# Patient Record
Sex: Male | Born: 1960
Health system: Southern US, Community
[De-identification: ages and names within clinical notes are randomized; demographics above are authoritative.]

## PROBLEM LIST (undated history)

## (undated) DIAGNOSIS — I10 Essential (primary) hypertension: Secondary | ICD-10-CM

## (undated) DIAGNOSIS — E669 Obesity, unspecified: Secondary | ICD-10-CM

## (undated) DIAGNOSIS — R739 Hyperglycemia, unspecified: Secondary | ICD-10-CM

## (undated) DIAGNOSIS — C61 Malignant neoplasm of prostate: Secondary | ICD-10-CM

## (undated) DIAGNOSIS — I2510 Atherosclerotic heart disease of native coronary artery without angina pectoris: Secondary | ICD-10-CM

## (undated) DIAGNOSIS — R042 Hemoptysis: Secondary | ICD-10-CM

## (undated) DIAGNOSIS — N63 Unspecified lump in unspecified breast: Secondary | ICD-10-CM

## (undated) DIAGNOSIS — R918 Other nonspecific abnormal finding of lung field: Secondary | ICD-10-CM

## (undated) DIAGNOSIS — E785 Hyperlipidemia, unspecified: Secondary | ICD-10-CM

## (undated) DIAGNOSIS — J439 Emphysema, unspecified: Secondary | ICD-10-CM

## (undated) DIAGNOSIS — I208 Other forms of angina pectoris: Secondary | ICD-10-CM

## (undated) DIAGNOSIS — J301 Allergic rhinitis due to pollen: Secondary | ICD-10-CM

## (undated) DIAGNOSIS — Z7901 Long term (current) use of anticoagulants: Secondary | ICD-10-CM

## (undated) DIAGNOSIS — F419 Anxiety disorder, unspecified: Secondary | ICD-10-CM

## (undated) DIAGNOSIS — I480 Paroxysmal atrial fibrillation: Secondary | ICD-10-CM

## (undated) DIAGNOSIS — Z6834 Body mass index (BMI) 34.0-34.9, adult: Secondary | ICD-10-CM

## (undated) DIAGNOSIS — R972 Elevated prostate specific antigen [PSA]: Secondary | ICD-10-CM

## (undated) HISTORY — DX: Body mass index (BMI) 34.0-34.9, adult: Z68.34

## (undated) HISTORY — DX: Essential (primary) hypertension: I10

## (undated) HISTORY — DX: Allergic rhinitis due to pollen: J30.1

## (undated) HISTORY — DX: Other forms of angina pectoris: I20.8

## (undated) HISTORY — DX: Anxiety disorder, unspecified: F41.9

## (undated) HISTORY — DX: Hyperglycemia, unspecified: R73.9

## (undated) HISTORY — DX: Hemoptysis: R04.2

## (undated) HISTORY — PX: PROSTATE BIOPSY: SHX241

## (undated) HISTORY — PX: NO PAST SURGERIES: SHX2092

## (undated) HISTORY — DX: Unspecified lump in unspecified breast: N63.0

## (undated) HISTORY — DX: Elevated prostate specific antigen (PSA): R97.20

## (undated) HISTORY — DX: Obesity, unspecified: E66.9

## (undated) HISTORY — DX: Hyperlipidemia, unspecified: E78.5

---

## 2010-06-25 HISTORY — PX: COLONOSCOPY WITH PROPOFOL: SHX5780

## 2016-03-26 DIAGNOSIS — Z6831 Body mass index (BMI) 31.0-31.9, adult: Secondary | ICD-10-CM | POA: Diagnosis not present

## 2016-03-26 DIAGNOSIS — F419 Anxiety disorder, unspecified: Secondary | ICD-10-CM | POA: Diagnosis not present

## 2016-03-26 DIAGNOSIS — I10 Essential (primary) hypertension: Secondary | ICD-10-CM | POA: Diagnosis not present

## 2016-03-26 DIAGNOSIS — E669 Obesity, unspecified: Secondary | ICD-10-CM | POA: Diagnosis not present

## 2016-06-12 DIAGNOSIS — R739 Hyperglycemia, unspecified: Secondary | ICD-10-CM | POA: Diagnosis not present

## 2016-06-12 DIAGNOSIS — I10 Essential (primary) hypertension: Secondary | ICD-10-CM | POA: Diagnosis not present

## 2016-06-12 DIAGNOSIS — Z23 Encounter for immunization: Secondary | ICD-10-CM | POA: Diagnosis not present

## 2016-06-12 DIAGNOSIS — E669 Obesity, unspecified: Secondary | ICD-10-CM | POA: Diagnosis not present

## 2016-06-12 DIAGNOSIS — Z0000 Encounter for general adult medical examination without abnormal findings: Secondary | ICD-10-CM | POA: Diagnosis not present

## 2016-07-06 DIAGNOSIS — R972 Elevated prostate specific antigen [PSA]: Secondary | ICD-10-CM | POA: Diagnosis not present

## 2016-07-06 DIAGNOSIS — E669 Obesity, unspecified: Secondary | ICD-10-CM | POA: Diagnosis not present

## 2016-07-06 DIAGNOSIS — Z6832 Body mass index (BMI) 32.0-32.9, adult: Secondary | ICD-10-CM | POA: Diagnosis not present

## 2017-01-07 DIAGNOSIS — Z1389 Encounter for screening for other disorder: Secondary | ICD-10-CM | POA: Diagnosis not present

## 2017-01-07 DIAGNOSIS — I10 Essential (primary) hypertension: Secondary | ICD-10-CM | POA: Diagnosis not present

## 2017-01-07 DIAGNOSIS — E669 Obesity, unspecified: Secondary | ICD-10-CM | POA: Diagnosis not present

## 2017-01-07 DIAGNOSIS — F419 Anxiety disorder, unspecified: Secondary | ICD-10-CM | POA: Diagnosis not present

## 2017-05-21 DIAGNOSIS — D225 Melanocytic nevi of trunk: Secondary | ICD-10-CM | POA: Diagnosis not present

## 2017-05-21 DIAGNOSIS — D224 Melanocytic nevi of scalp and neck: Secondary | ICD-10-CM | POA: Diagnosis not present

## 2017-05-21 DIAGNOSIS — L821 Other seborrheic keratosis: Secondary | ICD-10-CM | POA: Diagnosis not present

## 2017-05-21 DIAGNOSIS — D22121 Melanocytic nevi of left upper eyelid, including canthus: Secondary | ICD-10-CM | POA: Diagnosis not present

## 2017-06-13 DIAGNOSIS — I10 Essential (primary) hypertension: Secondary | ICD-10-CM | POA: Diagnosis not present

## 2017-06-13 DIAGNOSIS — F419 Anxiety disorder, unspecified: Secondary | ICD-10-CM | POA: Diagnosis not present

## 2017-06-13 DIAGNOSIS — Z23 Encounter for immunization: Secondary | ICD-10-CM | POA: Diagnosis not present

## 2017-06-13 DIAGNOSIS — Z0000 Encounter for general adult medical examination without abnormal findings: Secondary | ICD-10-CM | POA: Diagnosis not present

## 2017-07-12 DIAGNOSIS — J029 Acute pharyngitis, unspecified: Secondary | ICD-10-CM | POA: Diagnosis not present

## 2017-07-12 DIAGNOSIS — Z6834 Body mass index (BMI) 34.0-34.9, adult: Secondary | ICD-10-CM | POA: Diagnosis not present

## 2018-01-16 DIAGNOSIS — J019 Acute sinusitis, unspecified: Secondary | ICD-10-CM | POA: Diagnosis not present

## 2018-01-16 DIAGNOSIS — I10 Essential (primary) hypertension: Secondary | ICD-10-CM | POA: Diagnosis not present

## 2018-01-16 DIAGNOSIS — F419 Anxiety disorder, unspecified: Secondary | ICD-10-CM | POA: Diagnosis not present

## 2018-01-16 DIAGNOSIS — J301 Allergic rhinitis due to pollen: Secondary | ICD-10-CM | POA: Diagnosis not present

## 2018-07-03 DIAGNOSIS — N6320 Unspecified lump in the left breast, unspecified quadrant: Secondary | ICD-10-CM | POA: Diagnosis not present

## 2018-07-03 DIAGNOSIS — F419 Anxiety disorder, unspecified: Secondary | ICD-10-CM | POA: Diagnosis not present

## 2018-07-03 DIAGNOSIS — Z79899 Other long term (current) drug therapy: Secondary | ICD-10-CM | POA: Diagnosis not present

## 2018-07-03 DIAGNOSIS — Z0000 Encounter for general adult medical examination without abnormal findings: Secondary | ICD-10-CM | POA: Diagnosis not present

## 2018-07-14 DIAGNOSIS — H00022 Hordeolum internum right lower eyelid: Secondary | ICD-10-CM | POA: Diagnosis not present

## 2018-07-17 DIAGNOSIS — F419 Anxiety disorder, unspecified: Secondary | ICD-10-CM | POA: Diagnosis not present

## 2018-07-17 DIAGNOSIS — I10 Essential (primary) hypertension: Secondary | ICD-10-CM | POA: Diagnosis not present

## 2018-07-17 DIAGNOSIS — H00019 Hordeolum externum unspecified eye, unspecified eyelid: Secondary | ICD-10-CM | POA: Diagnosis not present

## 2018-07-17 DIAGNOSIS — R972 Elevated prostate specific antigen [PSA]: Secondary | ICD-10-CM | POA: Diagnosis not present

## 2018-07-24 DIAGNOSIS — N6489 Other specified disorders of breast: Secondary | ICD-10-CM | POA: Diagnosis not present

## 2018-07-24 DIAGNOSIS — R972 Elevated prostate specific antigen [PSA]: Secondary | ICD-10-CM | POA: Diagnosis not present

## 2018-07-24 DIAGNOSIS — R928 Other abnormal and inconclusive findings on diagnostic imaging of breast: Secondary | ICD-10-CM | POA: Diagnosis not present

## 2018-07-24 DIAGNOSIS — N401 Enlarged prostate with lower urinary tract symptoms: Secondary | ICD-10-CM | POA: Diagnosis not present

## 2018-07-24 DIAGNOSIS — N6321 Unspecified lump in the left breast, upper outer quadrant: Secondary | ICD-10-CM | POA: Diagnosis not present

## 2018-08-18 ENCOUNTER — Encounter: Payer: Self-pay | Admitting: Nurse Practitioner

## 2018-08-18 ENCOUNTER — Ambulatory Visit
Admission: RE | Admit: 2018-08-18 | Discharge: 2018-08-18 | Disposition: A | Discharge status: Home / Self Care | Payer: BLUE CROSS/BLUE SHIELD | Source: Ambulatory Visit | Attending: Nurse Practitioner | Admitting: Nurse Practitioner

## 2018-08-18 ENCOUNTER — Ambulatory Visit: Payer: BLUE CROSS/BLUE SHIELD | Admitting: Nurse Practitioner

## 2018-08-18 ENCOUNTER — Encounter (INDEPENDENT_AMBULATORY_CARE_PROVIDER_SITE_OTHER): Payer: Self-pay

## 2018-08-18 VITALS — BP 150/90 | HR 72 | Ht 73.0 in | Wt 264.1 lb

## 2018-08-18 DIAGNOSIS — R0609 Other forms of dyspnea: Secondary | ICD-10-CM | POA: Diagnosis not present

## 2018-08-18 DIAGNOSIS — R0602 Shortness of breath: Secondary | ICD-10-CM

## 2018-08-18 DIAGNOSIS — I10 Essential (primary) hypertension: Secondary | ICD-10-CM

## 2018-08-18 DIAGNOSIS — R079 Chest pain, unspecified: Secondary | ICD-10-CM

## 2018-08-18 MED ORDER — AMLODIPINE BESYLATE 5 MG PO TABS: 5.0000 mg | ORAL_TABLET | Freq: Every day | ORAL | 3 refills | Status: DC

## 2018-08-18 NOTE — Progress Notes (Signed)
CARDIOLOGY OFFICE NOTE  Date:  08/18/2018    [name] Date of Birth: Mar 17, 1961 Medical Record #[id number]  PCP:  [name], MD  Cardiologist:  Servando Snare   Chief Complaint  Patient presents with  . Hypertension  . Chest Pain  . Fatigue    New patient visit     History of Present Illness: James Weeks is a 58 y.o. male who presents today for a new patient visit. I am an acquaintance of his wife's.   He has a history of HTN, HLD, elevated PSA, elevated blood sugar, obesity and anxiety. Looks to have had past intolerance to HCTZ due to gynecomastia. FH is quite + for CAD. He is a smoker - has stopped cigarettes and now smokes cigars.   Comes in today. Here with his wife - James Weeks. He notes that he has had known HTN for about 10 years - had had a spell of presyncope with chest pain 10 years ago - had a stress test and echo - BP was very high - he was started on BP meds and has been on ever since. He has most recently had his HCTZ stopped due to gynecomastia - he has had mammogram due to a nodular area in the left breast. HCTZ was stopped (was thought this might be the culprit) - BP now higher but sounds like really has not been ideally controlled. He stopped smoking cigarettes about 7 years ago or so. He has had some DOE - will have going up a flight of stairs - this is not consistent but more intermittent. Has noted some occasional wheezing. He is smoking cigars - to "help calm down". Sounds like some stress with his son. He notices a chest tightness - will be on the right or left upper side - this is with stress/anxiety - not exertional. He does not exercise. He feels like his sleep is ok. He does snore but feels rested the following day - no daytime somnolence. No active chest pain with exertion. He tries to be active with yard/house stuff - but stopped a regular exercise routine. He has gained weight since he stopped smoking cigarettes. He likes OJ and tea - some  alcohol reported. Lipids not too bad - LDL is 118. Triglycerides are elevated. He is not know to be diabetic. FH positive for CAD on his dad's side. Both of his parents are still alive. He has had an elevated PSA - has had a round of antibiotics - now on Flomax - to see urology back next month for repeat - if remains elevated will be getting prostate biopsy.   Past Medical History:  Diagnosis Date  . Allergic rhinitis due to pollen   . Angina of effort ([place])   . Anxiety   . Body mass index 34.0-34.9, adult   . Elevated blood sugar   . Hemoptysis   . Hyperlipidemia   . Hypertension   . Nodule of skin of breast   . Obese   . PSA elevation     Past Surgical History:  Procedure Laterality Date  . NO PAST SURGERIES       Medications: Current Meds  Medication Sig  . aspirin 81 MG EC tablet Take 81 mg by mouth daily. Swallow whole.  . diazepam (VALIUM) 10 MG tablet Take 5 mg by mouth every morning. anxiety  . FLUTICASONE PROPIONATE NA Place 50 mcg into the nose as needed (seasonal allergies).   . loratadine (CLARITIN) 10  MG tablet Take 10 mg by mouth daily.  Marland Kitchen losartan (COZAAR) 100 MG tablet Take 100 mg by mouth daily.  . metoprolol succinate (TOPROL-XL) 25 MG 24 hr tablet Take 50 mg by mouth at bedtime.   . tamsulosin (FLOMAX) 0.4 MG CAPS capsule Take 0.4 mg by mouth daily after supper.      Allergies: No Known Allergies  Social History: The patient  reports that he has been smoking cigars. He has been smoking about 0.50 packs per day. He has never used smokeless tobacco. He reports that he does not drink alcohol.   Family History: The patient's family history includes Asthma in his brother and father; CVA in an other family member; Diabetes in his mother; Heart attack in an other family member; Heart disease in his father and another family member; Hypertension in his mother and another family member.   Review of Systems: Please see the history of present illness.    Otherwise, the review of systems is positive for none.   All other systems are reviewed and negative.   Physical Exam: VS:  BP (!) 150/90 (BP Location: Left Arm, Patient Position: Sitting, Cuff Size: Large) Comment: office/ his cuff 171/113  Pulse 72   Ht 6\' 1"  (1.854 m)   Wt 264 lb 1.9 oz (119.8 kg)   BMI 34.85 kg/m  .  BMI Body mass index is 34.85 kg/m.  Wt Readings from Last 3 Encounters:  08/18/18 264 lb 1.9 oz (119.8 kg)   His BP is 160/110 in both arms by me with the large cuff.   General: Alert. His affect is flat. He is in no acute distress.   HEENT: Normal.  Neck: Supple, no JVD, carotid bruits, or masses noted.  Cardiac: Regular rate and rhythm. No murmurs, rubs, or gallops. No edema.  Respiratory:  Lungs are clear to auscultation bilaterally with normal work of breathing.  GI: Soft and nontender.  MS: No deformity or atrophy. Gait and ROM intact.  Skin: Warm and dry. Color is normal.  Neuro:  Strength and sensation are intact and no gross focal deficits noted.  Psych: Alert, appropriate and with normal affect.   LABORATORY DATA:  EKG:  EKG is ordered today. This demonstrates NSR - low voltage.  No results found for: WBC, HGB, HCT, PLT, GLUCOSE, CHOL, TRIG, HDL, LDLDIRECT, LDLCALC, ALT, AST, NA, K, CL, CREATININE, BUN, CO2, TSH, PSA, INR, GLUF, HGBA1C, MICROALBUR       BNP (last 3 results) No results for input(s): BNP in the last 8760 hours.  ProBNP (last 3 results) No results for input(s): PROBNP in the last 8760 hours.   Other Studies Reviewed Today:   Assessment/Plan:  1. Chest tightness - not exertional - seems more related to anxiety/stress with his son - but he has multiple CV risk factors (HTN, +FH, mild HLD, obesity, gender) - will arrange for coronary CT. Further disposition to follow. He is already on Toprol - will have him take an extra dose 2 hours prior to his CT.   2. DOE - prior smoker - will check BNP and send for CXR - will hold on echo  for now.   3. HTN - not controlled - he eats out once a day - most likely gets too much salt - was taken off HCTZ due to concerns for gynecomastia - adding Norvasc 5 mg to take at bedtime. Discussed salt restriction.   4. HLD - LDL of 118 - triglycerides too high - most likely  related to diet. He likes sugar.   5. Obesity - needs to get back to making his health a priority - discussed at length - hopefully if his CT is ok, he can get back to an exercise routine. I think this would help a lot of with his issues of anxiety/stress.   Current medicines are reviewed with the patient today.  The patient does not have concerns regarding medicines other than what has been noted above.  The following changes have been made:  See above.  Labs/ tests ordered today include:    Orders Placed This Encounter  Procedures  . CT CORONARY MORPH W/CTA COR W/SCORE W/CA W/CM &/OR WO/CM  . CT CORONARY FRACTIONAL FLOW RESERVE DATA PREP  . CT CORONARY FRACTIONAL FLOW RESERVE FLUID ANALYSIS  . DG Chest 2 View  . Pro b natriuretic peptide (BNP)  . EKG 12-Lead     Disposition:   Further disposition pending.   Patient is agreeable to this plan and will call if any problems develop in the interim.   SignedTruitt Merle, NP  08/18/2018 12:17 PM  [facility] [address] [place], [place]  [address] Phone: [phone] Fax: [phone]

## 2018-08-18 NOTE — Patient Instructions (Addendum)
We will be checking the following labs today - BNP    Medication Instructions:    Continue with your current medicines. BUT  I am adding Norvasc 5 mg to take at bedtime - this has been sent to your pharmacy.    If you need a refill on your cardiac medications before your next appointment, please call your pharmacy.     Testing/Procedures To Be Arranged:  Coronary CT scan  Follow-Up:   See me after your studies are complete     At [facility], you and your health needs are our priority.  As part of our continuing mission to provide you with exceptional heart care, we have created designated Provider Care Teams.  These Care Teams include your primary Cardiologist (physician) and Advanced Practice Providers (APPs -  Physician Assistants and Nurse Practitioners) who all work together to provide you with the care you need, when you need it.      Special Instructions for Coronary CT:  Please arrive at the [facility] main entrance of [facility] at September 05, 2018 Friday tentatively scheduled for 7:45 AM (be there 30-45 minutes prior to test start time - 7AM)  [facility] [place], [place] [address] [phone]  Proceed to the [facility] Radiology Department (First Floor).  Please follow these instructions carefully (unless otherwise directed):  Hold all erectile dysfunction medications at least 48 hours prior to test.  On the Night Before the Test: Be sure to Drink plenty of water. Do not consume any caffeinated/decaffeinated beverages or chocolate 12 hours prior to your test. Do not take any antihistamines 12 hours prior to your test.   On the Day of the Test: Drink plenty of water. Do not drink any water within one hour of the test. Do not eat any food 4 hours prior to the test. You may take your regular medications prior to the test.  Take your regular dose of Toprol the night prior to your CT scan. On the morning of your  study - take another dose of Toprol 50 mg 2 hours prior to the scan.     After the Test: Drink plenty of water. After receiving IV contrast, you may experience a mild flushed feeling. This is normal. On occasion, you may experience a mild rash up to 24 hours after the test. This is not dangerous. If this occurs, you can take Benadryl 25 mg and increase your fluid intake. If you experience trouble breathing, this can be serious. If it is severe call 911 IMMEDIATELY. If it is mild, please call our office. If you take any of these medications: Glipizide/Metformin, Avandament, Glucavance, please do not take 48 hours after completing test.  .   Call the [place] office at [phone] if you have any questions, problems or concerns.

## 2018-08-19 LAB — PRO B NATRIURETIC PEPTIDE: NT-Pro BNP: 49 pg/mL (ref 0–210)

## 2018-08-20 ENCOUNTER — Telehealth: Payer: Self-pay | Admitting: Nurse Practitioner

## 2018-08-20 NOTE — Telephone Encounter (Signed)
New message:   Patient returning call back concerning results. Please call patient back.

## 2018-08-26 DIAGNOSIS — R972 Elevated prostate specific antigen [PSA]: Secondary | ICD-10-CM | POA: Diagnosis not present

## 2018-08-26 DIAGNOSIS — N401 Enlarged prostate with lower urinary tract symptoms: Secondary | ICD-10-CM | POA: Diagnosis not present

## 2018-09-01 ENCOUNTER — Telehealth: Payer: Self-pay | Admitting: *Deleted

## 2018-09-01 NOTE — Telephone Encounter (Signed)
Faxed paperwork to [facility], needed pt's insurance card and a Dx: for BNP.  Asked Georgana Curio, RN, if this was ok to fax, stated yes.

## 2018-09-03 ENCOUNTER — Telehealth (HOSPITAL_COMMUNITY): Payer: Self-pay | Admitting: Emergency Medicine

## 2018-09-03 ENCOUNTER — Telehealth: Payer: Self-pay | Admitting: Nurse Practitioner

## 2018-09-03 NOTE — Telephone Encounter (Signed)
lvm to go over ct instructions on pts' AVS.

## 2018-09-03 NOTE — Telephone Encounter (Signed)
Pt has a procedure on Friday morning. Pre-op called him and he says their details weren't specific.  [name] gave him more specific instructions at his visit, specifically about taking his medication, but he he did not write down, and he needs clarification.  Please advise

## 2018-09-03 NOTE — Telephone Encounter (Signed)
Reaching out to patient to offer assistance regarding upcoming cardiac imaging study; pt verbalizes understanding of appt date/time, parking situation and where to check in, pre-test NPO status and medications ordered, and verified current allergies; name and call back number provided for further questions should they arise James Ernster RN Navigator Cardiac Imaging [facility] [phone] office [phone] cell 

## 2018-09-05 ENCOUNTER — Other Ambulatory Visit: Payer: Self-pay

## 2018-09-05 ENCOUNTER — Telehealth: Payer: Self-pay

## 2018-09-05 ENCOUNTER — Ambulatory Visit (HOSPITAL_COMMUNITY)
Admission: RE | Admit: 2018-09-05 | Discharge: 2018-09-05 | Disposition: A | Discharge status: Home / Self Care | Payer: BLUE CROSS/BLUE SHIELD | Source: Ambulatory Visit | Attending: Nurse Practitioner | Admitting: Nurse Practitioner

## 2018-09-05 DIAGNOSIS — I10 Essential (primary) hypertension: Secondary | ICD-10-CM | POA: Insufficient documentation

## 2018-09-05 DIAGNOSIS — R079 Chest pain, unspecified: Secondary | ICD-10-CM | POA: Insufficient documentation

## 2018-09-05 DIAGNOSIS — R0602 Shortness of breath: Secondary | ICD-10-CM | POA: Insufficient documentation

## 2018-09-05 MED ORDER — NITROGLYCERIN 0.4 MG SL SUBL
0.8000 mg | SUBLINGUAL_TABLET | Freq: Once | SUBLINGUAL | Status: AC
  Administered 2018-09-05: 0.8 mg via SUBLINGUAL
  Filled 2018-09-05: qty 25

## 2018-09-05 MED ORDER — NITROGLYCERIN 0.4 MG SL SUBL
SUBLINGUAL_TABLET | SUBLINGUAL | Status: AC
  Filled 2018-09-05: qty 2

## 2018-09-05 MED ORDER — METOPROLOL TARTRATE 5 MG/5ML IV SOLN
5.0000 mg | INTRAVENOUS | Status: DC | PRN
  Administered 2018-09-05: 5 mg via INTRAVENOUS
  Filled 2018-09-05: qty 5

## 2018-09-05 MED ORDER — METOPROLOL TARTRATE 5 MG/5ML IV SOLN
INTRAVENOUS | Status: AC
  Administered 2018-09-05: 5 mg
  Filled 2018-09-05: qty 10

## 2018-09-05 MED ORDER — IOHEXOL 350 MG/ML SOLN
80.0000 mL | Freq: Once | INTRAVENOUS | Status: AC | PRN
  Administered 2018-09-05: 80 mL via INTRAVENOUS

## 2018-09-05 NOTE — Telephone Encounter (Signed)
Jane with [facility] Radiology called to report that the pt had a coronary CT today and with the radiologist overread it was noted the pt had a Left Upper Lobe Pulmonary Nodule.... report in Epic for Lori's review...  Recommendation from Radiologist to repeat in 12 months with non contrast CT if high risk and if low risk.. no follow up..   Will forward to [name] NP for her review and recommendation.

## 2018-09-05 NOTE — Telephone Encounter (Signed)
Thank you. I have looked at it - he is seeing me on Monday.   [name]

## 2018-09-08 ENCOUNTER — Encounter: Payer: Self-pay | Admitting: Nurse Practitioner

## 2018-09-08 ENCOUNTER — Other Ambulatory Visit: Payer: Self-pay | Admitting: *Deleted

## 2018-09-08 ENCOUNTER — Other Ambulatory Visit: Payer: Self-pay

## 2018-09-08 ENCOUNTER — Ambulatory Visit: Payer: BLUE CROSS/BLUE SHIELD | Admitting: Nurse Practitioner

## 2018-09-08 VITALS — BP 118/78 | HR 78 | Ht 73.0 in | Wt 267.8 lb

## 2018-09-08 DIAGNOSIS — R911 Solitary pulmonary nodule: Secondary | ICD-10-CM | POA: Diagnosis not present

## 2018-09-08 DIAGNOSIS — E7849 Other hyperlipidemia: Secondary | ICD-10-CM | POA: Diagnosis not present

## 2018-09-08 NOTE — Progress Notes (Signed)
CARDIOLOGY OFFICE NOTE  Date:  09/08/2018    James Weeks Date of Birth: 1960/07/29 Medical Record #[id number]  PCP:  [name], MD  Cardiologist:  Servando Snare   Chief Complaint  Patient presents with  . Follow-up    History of Present Illness: James Weeks is a 58 y.o. male who presents today for a follow up visit. He will follow with me. I am an acquaintance of his wife's.   He has a history of HTN, HLD, elevated PSA, elevated blood sugar, obesity and anxiety. Looks to have had past intolerance to HCTZ due to gynecomastia. FH is quite + for CAD. He is a smoker - has stopped cigarettes and now smokes cigars.   I saw him a few weeks ago - has had long standing HTN. Had a spell of presyncope aobut 10 years ago - negative stress test and echo at that time - BP very high and he was started on antihypertensive regimen. He has had his HCTZ stopped due to gynecomastia. He self referred himself here for DOE and chest tightness. BP still not controlled. Norvasc was added. Coronary CT obtained. To consider sleep study. Had stopped exercising. Has had elevated PSA - placed on antibiotics and may be needing prostate biopsy.   09/08/18 Patient and wife screened for recent travel, fever, URI symptoms and shortness of breath. Patient and wife deny travel over the last 14 days and are currently without symptoms.    Comes in today. Here with his wife [name]. He feels well. Anxious to get back to exercise. Seems motivated to work on CV risk factor modification. BP is great. Not dizzy or lightheaded. Will be having prostate biopsy later this month for continued elevation in his PSA.  Reviewed sleep issues - not really felt to be an issue.   Past Medical History:  Diagnosis Date  . Allergic rhinitis due to pollen   . Angina of effort ([place])   . Anxiety   . Body mass index 34.0-34.9, adult   . Elevated blood sugar   . Hemoptysis   . Hyperlipidemia   . Hypertension   . Nodule  of skin of breast   . Obese   . PSA elevation     Past Surgical History:  Procedure Laterality Date  . NO PAST SURGERIES       Medications: Current Meds  Medication Sig  . amLODipine (NORVASC) 5 MG tablet Take 1 tablet (5 mg total) by mouth at bedtime.  Marland Kitchen aspirin 81 MG EC tablet Take 81 mg by mouth daily. Swallow whole.  . diazepam (VALIUM) 10 MG tablet Take 5 mg by mouth every morning. anxiety  . diazepam (VALIUM) 10 MG tablet   . FLUTICASONE PROPIONATE NA Place 50 mcg into the nose as needed (seasonal allergies).   . loratadine (CLARITIN) 10 MG tablet Take 10 mg by mouth daily.  Marland Kitchen losartan (COZAAR) 100 MG tablet Take 100 mg by mouth daily.  . metoprolol succinate (TOPROL-XL) 50 MG 24 hr tablet   . tamsulosin (FLOMAX) 0.4 MG CAPS capsule Take 0.4 mg by mouth daily after supper.      Allergies: No Known Allergies  Social History: The patient  reports that he has been smoking cigars. He has been smoking about 0.50 packs per day. He has never used smokeless tobacco. He reports that he does not drink alcohol.   Family History: The patient's family history includes Asthma in his brother and father; CVA in an other  family member; Diabetes in his mother; Heart attack in an other family member; Heart disease in his father and another family member; Hypertension in his mother and another family member.   Review of Systems: Please see the history of present illness.   Otherwise, the review of systems is positive for none.   All other systems are reviewed and negative.   Physical Exam: VS:  BP 118/78 (BP Location: Left Arm, Patient Position: Sitting, Cuff Size: Normal)   Pulse 78   Ht 6\' 1"  (1.854 m)   Wt 267 lb 12.8 oz (121.5 kg)   SpO2 97% Comment: at rest  BMI 35.33 kg/m  .  BMI Body mass index is 35.33 kg/m.  Wt Readings from Last 3 Encounters:  09/08/18 267 lb 12.8 oz (121.5 kg)  08/18/18 264 lb 1.9 oz (119.8 kg)    General: Pleasant. Well developed, well nourished and  in no acute distress.   HEENT: Normal.  Neck: Supple, no JVD, carotid bruits, or masses noted.  Cardiac: Regular rate and rhythm. No murmurs, rubs, or gallops. No edema.  Respiratory:  Lungs are clear to auscultation bilaterally with normal work of breathing.  GI: Soft and nontender.  MS: No deformity or atrophy. Gait and ROM intact.  Skin: Warm and dry. Color is normal.  Neuro:  Strength and sensation are intact and no gross focal deficits noted.  Psych: Alert, appropriate and with normal affect.   LABORATORY DATA:  EKG:  EKG is not ordered today.  No results found for: WBC, HGB, HCT, PLT, GLUCOSE, CHOL, TRIG, HDL, LDLDIRECT, LDLCALC, ALT, AST, NA, K, CL, CREATININE, BUN, CO2, TSH, PSA, INR, GLUF, HGBA1C, MICROALBUR     BNP (last 3 results) No results for input(s): BNP in the last 8760 hours.  ProBNP (last 3 results) Recent Labs    08/18/18 1228  PROBNP 49     Other Studies Reviewed Today: FINDINGS: Coronary calcium score: The patient's coronary artery calcium score is 1, which places the patient in the 35 percentile.  Coronary arteries: Normal coronary origins.  Right dominance.  Moderately tortuous coronary arteries.  Right Coronary Artery: No detectable plaque or stenosis.  Left Main Coronary Artery: No detectable plaque or stenosis.  Left Anterior Descending Coronary Artery: Mild mixed atherosclerotic plaque in the mid LAD (25-49% stenosis), otherwise no significant stenosis.  Left Circumflex Artery: No detectable plaque or stenosis.  Aorta: Normal size, 36 mm at mid ascending aorta (level of PA bifurcation, double oblique measurement). No calcifications. No dissection.  Aortic Valve: No calcifications.  Other findings:  Normal pulmonary vein drainage into the left atrium.  Normal left atrial appendage without a thrombus.  Borderline enlargement of main pulmonary artery, approximately 30 mm.  IMPRESSION: 1. Mild CAD of mid LAD,  CADRADS = 2. Moderately tortuous coronary arteries.  2. The patient's coronary artery calcium score is 1, which places the patient in the 70 percentile for age and sex matched control.  3.  Normal coronary origin with right dominance.   Electronically Signed   By: Cherlynn Kaiser   On: 09/05/2018 12:35   FINDINGS: Limited view of the lung parenchyma demonstrates 4 mm nodule in the LEFT upper lobe (image 7/12). Airways are normal.  Limited view of the mediastinum demonstrates no adenopathy. Esophagus normal.  Limited view of the upper abdomen unremarkable.  Limited view of the skeleton and chest wall is unremarkable.  IMPRESSION: LEFT upper lobe noncalcified pulmonary nodule. No follow-up needed if patient is low-risk. Non-contrast chest CT  can be considered in 12 months if patient is high-risk. This recommendation follows the consensus statement: Guidelines for Management of Incidental Pulmonary Nodules Detected on CT Images: From the Fleischner Society 2017; Radiology 2017; 284:228-243.  These results will be called to the ordering clinician or representative by the Radiologist Assistant, and communication documented in the PACS or zVision Dashboard.  Electronically Signed: By: Suzy Bouchard M.D. On: 09/05/2018 09:10   Assessment/Plan:  1. Chest tightness - reassuring coronary CT - needs aggressive risk factor modification for mild CAD noted.   2. Lung nodule - he is a prior smoker - still with some cigar use - needs follow up scan in one year - total smoking cessation encouraged.   3. HTN - Norvasc has been added - BP is great. No changes made. He will continue to monitor.   4. HLD - he would like 3 months of exercise/diet/weight loss - if not improved would start low dose statin. Recheck lab here in 3 months.   5. Obesity - seems motivated to make changes.   6. Elevated PSA - to have biopsy later this month - this is ok from our standpoint. Ok  to hold aspirin as needed as well.   Current medicines are reviewed with the patient today.  The patient does not have concerns regarding medicines other than what has been noted above.  The following changes have been made:  See above.  Labs/ tests ordered today include:    Orders Placed This Encounter  Procedures  . CT CHEST NODULE FOLLOW UP LOW DOSE W/O  . Hepatic function panel  . Lipid panel     Disposition:   FU with me tentatively in one year.   Patient is agreeable to this plan and will call if any problems develop in the interim.   SignedTruitt Merle, NP  09/08/2018 9:13 AM  [facility] [address] [place], [place]  [address] Phone: [phone] Fax: [phone]

## 2018-09-08 NOTE — Patient Instructions (Addendum)
We will be checking the following labs today - NONE  Fasting labs in 3 months - lipids and LFTs    Medication Instructions:    Continue with your current medicines.    If you need a refill on your cardiac medications before your next appointment, please call your pharmacy.     Testing/Procedures To Be Arranged:  Non Contrast CT of the chest in 12 months  Follow-Up:   See me in one yearr    At [facility], you and your health needs are our priority.  As part of our continuing mission to provide you with exceptional heart care, we have created designated Provider Care Teams.  These Care Teams include your primary Cardiologist (physician) and Advanced Practice Providers (APPs -  Physician Assistants and Nurse Practitioners) who all work together to provide you with the care you need, when you need it.  Special Instructions:  . None  Call the [place] office at [phone] if you have any questions, problems or concerns.

## 2018-09-19 DIAGNOSIS — R972 Elevated prostate specific antigen [PSA]: Secondary | ICD-10-CM | POA: Diagnosis not present

## 2018-10-07 DIAGNOSIS — R972 Elevated prostate specific antigen [PSA]: Secondary | ICD-10-CM | POA: Diagnosis not present

## 2018-10-07 DIAGNOSIS — C61 Malignant neoplasm of prostate: Secondary | ICD-10-CM | POA: Diagnosis not present

## 2018-10-21 DIAGNOSIS — C61 Malignant neoplasm of prostate: Secondary | ICD-10-CM | POA: Diagnosis not present

## 2018-12-09 ENCOUNTER — Other Ambulatory Visit: Payer: BLUE CROSS/BLUE SHIELD

## 2019-03-19 DIAGNOSIS — I10 Essential (primary) hypertension: Secondary | ICD-10-CM | POA: Diagnosis not present

## 2019-03-19 DIAGNOSIS — F419 Anxiety disorder, unspecified: Secondary | ICD-10-CM | POA: Diagnosis not present

## 2019-03-19 DIAGNOSIS — C61 Malignant neoplasm of prostate: Secondary | ICD-10-CM | POA: Diagnosis not present

## 2019-03-24 DIAGNOSIS — R972 Elevated prostate specific antigen [PSA]: Secondary | ICD-10-CM | POA: Diagnosis not present

## 2019-03-26 DIAGNOSIS — C61 Malignant neoplasm of prostate: Secondary | ICD-10-CM | POA: Diagnosis not present

## 2019-04-07 DIAGNOSIS — F419 Anxiety disorder, unspecified: Secondary | ICD-10-CM | POA: Diagnosis not present

## 2019-04-07 DIAGNOSIS — I10 Essential (primary) hypertension: Secondary | ICD-10-CM | POA: Diagnosis not present

## 2019-04-07 DIAGNOSIS — E669 Obesity, unspecified: Secondary | ICD-10-CM | POA: Diagnosis not present

## 2019-04-07 DIAGNOSIS — L509 Urticaria, unspecified: Secondary | ICD-10-CM | POA: Diagnosis not present

## 2019-04-07 DIAGNOSIS — T783XXA Angioneurotic edema, initial encounter: Secondary | ICD-10-CM | POA: Diagnosis not present

## 2019-04-07 DIAGNOSIS — J301 Allergic rhinitis due to pollen: Secondary | ICD-10-CM | POA: Diagnosis not present

## 2019-04-16 ENCOUNTER — Ambulatory Visit: Payer: BC Managed Care – PPO | Admitting: Allergy and Immunology

## 2019-06-09 ENCOUNTER — Ambulatory Visit (INDEPENDENT_AMBULATORY_CARE_PROVIDER_SITE_OTHER): Payer: BC Managed Care – PPO | Admitting: Allergy

## 2019-06-09 ENCOUNTER — Encounter: Payer: Self-pay | Admitting: Allergy

## 2019-06-09 ENCOUNTER — Other Ambulatory Visit: Payer: Self-pay

## 2019-06-09 VITALS — BP 132/84 | HR 84 | Temp 97.2°F | Resp 20 | Ht 72.0 in | Wt 268.0 lb

## 2019-06-09 DIAGNOSIS — T7800XA Anaphylactic reaction due to unspecified food, initial encounter: Secondary | ICD-10-CM | POA: Diagnosis not present

## 2019-06-09 DIAGNOSIS — J310 Chronic rhinitis: Secondary | ICD-10-CM | POA: Diagnosis not present

## 2019-06-09 NOTE — Progress Notes (Signed)
New Patient Note  RE: James Weeks MRN: [id number] DOB: [birth year] Date of Office Visit: 06/09/2019  Referring provider: [name], MD Primary care provider: [name], MD  Chief Complaint: reaction after hamburger  History of present illness: James Weeks is a 58 y.o. male presenting today for consultation for alpha gal allergy.   He reports eating hamburgers for dinner and he went to bed without any issues.  He woke in the middle of the night with "head to toe" hives.  He reports "everyting was restricted".  However denies having respiratory, GI or CV related symptoms.  He took benadryl which did help itch/hives.  The rash was gone by the time he woke up again for the day.  He had a doctor's appt already scheduled and he told him about the rash.  His PCP then tested for alpha gal allergy which came back positive.  This occurred about 1.5-2 months ago.  He was prescribed an epinephrine device.  He does report having several tick bites over this summer one of which he states left a mark after removal.   He denies having any issues with dairy ingestion or gelatin based products at this time.   He denies having hives prior to this reaction.  He denies any other concerns for other food allergy.  He does report taking claritin and using flonase as needed.  He reports specifically strong odors like perfumes can trigger nasal drainage and watery eyes.  He does feel that claritin and flonase helps.   No history of eczema or asthma.   Review of systems (within past 4 weeks): Review of Systems  Constitutional: Negative.   HENT: Negative.   Eyes: Negative.   Respiratory: Negative.   Cardiovascular: Negative.   Gastrointestinal: Negative.   Musculoskeletal: Negative.   Skin: Negative.   Neurological: Negative.     All other systems negative unless noted above in HPI  Past medical history: Past Medical History:  Diagnosis Date  . Allergic rhinitis due to pollen     . Angina of effort ([place])   . Anxiety   . Body mass index 34.0-34.9, adult   . Elevated blood sugar   . Hemoptysis   . Hyperlipidemia   . Hypertension   . Nodule of skin of breast   . Obese   . PSA elevation     Past surgical history: Past Surgical History:  Procedure Laterality Date  . NO PAST SURGERIES      Family history:  Family History  Problem Relation Age of Onset  . Hypertension Mother   . Diabetes Mother   . Heart disease Father   . Asthma Father   . Asthma Brother   . Hypertension Other   . Heart disease Other   . Heart attack Other   . CVA Other     Social history: Lives in a home with carpeting with electric heating as well as a heat pump and central cooling.  No pets in the home but there are dog and cat outside the home.  There is no concern for water damage, mildew or roaches in the home.  He is a Administrator.  He denies a smoking history.   Medication List: Current Outpatient Medications  Medication Sig Dispense Refill  . aspirin 81 MG EC tablet Take 81 mg by mouth daily. Swallow whole.    . diazepam (VALIUM) 10 MG tablet Take 5 mg by mouth every morning. anxiety    .  EPINEPHrine 0.3 mg/0.3 mL IJ SOAJ injection Inject 1 mg into the muscle as directed.    Marland Kitchen FLUTICASONE PROPIONATE NA Place 50 mcg into the nose as needed (seasonal allergies).     . loratadine (CLARITIN) 10 MG tablet Take 10 mg by mouth daily.    Marland Kitchen losartan (COZAAR) 100 MG tablet Take 100 mg by mouth daily.    . metoprolol succinate (TOPROL-XL) 50 MG 24 hr tablet     . tamsulosin (FLOMAX) 0.4 MG CAPS capsule Take 0.4 mg by mouth daily after supper.     Marland Kitchen amLODipine (NORVASC) 5 MG tablet Take 1 tablet (5 mg total) by mouth at bedtime. 90 tablet 3   No current facility-administered medications for this visit.    Known medication allergies: Allergies  Allergen Reactions  . Alpha-Gal Hives     Physical examination: Blood pressure 132/84, pulse 84, temperature (!) 97.2 F (36.2  C), temperature source Temporal, resp. rate 20, height 6' (1.829 m), weight 268 lb (121.6 kg), SpO2 95 %.  General: Alert, interactive, in no acute distress. HEENT: PERRLA, TMs pearly gray, turbinates non-edematous without discharge, post-pharynx non erythematous. Neck: Supple without lymphadenopathy. Lungs: Clear to auscultation without wheezing, rhonchi or rales. {no increased work of breathing. CV: Normal S1, S2 without murmurs. Abdomen: Nondistended, nontender. Skin: Warm and dry, without lesions or rashes. Extremities:  No clubbing, cyanosis or edema. Neuro:   Grossly intact.  Diagnositics/Labs: Labs: Alpha gal panel from 04/07/2019 shows beef IgE 4.84, lamb IgE 2.09, pork IgE 2.57, alpha gal IgE 16.7 Celiac panel is negative.  Assessment and plan:    Alpha-gal (Red Meat) Allergy   - continue avoidance of red meat products in the diet  - we did discuss today that other foods can also cause reactions with ingestion related to alpha gal which include dairy and gelatin products.  If you notice any symptoms with dairy or gelatin products then best to remove these from diet as well  - will plan to obtain alpha gal panel in a year to see if losing sensitivity   - have access to self-injectable epinephrine Epipen 0.3mg  at all times  - follow emergency action plan in case of allergic reaction  - provided with informational handout on alpha gal allergy  Rhinitis, presumed allergic  - continue to avoid strong odors (ie perfumes) as much as possible  - continue Claritin 10mg  daily as needed  - continue Flonase 2 sprays each nostril daily as needed for nasal congestion/drainage  - if interested in environmental allergy testing you can let us know and schedule for a skin testing visit  Follow-up 1 year or sooner if needed  I appreciate the opportunity to take part in [place] care. Please do not hesitate to contact me with questions.  Sincerely,   Prudy Feeler,  MD Allergy/Immunology [facility]

## 2019-06-09 NOTE — Patient Instructions (Signed)
Alpha-gal and Red Meat Allergy   - continue avoidance of red meat products in the diet  - we did discuss today that other foods can also cause reactions with ingestion related to alpha gal which include dairy and gelatin products.  If you notice any symptoms with dairy or gelatin products then best to remove these from diet as well  - will plan to obtain alpha gal panel in a year to see if losing sensitivity   - have access to self-injectable epinephrine Epipen 0.3mg  at all times  - follow emergency action plan in case of allergic reaction  - see below for more info regarding alpha gal allergy   Rhinitis, presumed allergic  - continue to avoid strong odors (ie perfumes) as much as possible  - continue Claritin 10mg  daily as needed  - continue Flonase 2 sprays each nostril daily as needed for nasal congestion/drainage  - if interested in environmental allergy testing you can let us know and schedule for a skin testing visit  Follow-up 1 year or sooner if needed   Overview for Alpha gal allergy An allergy to "alpha-gal" refers to having a severe and potentially life-threatening allergy to a carbohydrate molecule called galactose-alpha-1,3-galactose that is found in most mammalian or "red meat". Unlike other food allergies which typically occur within minutes of ingestion, symptoms from eating red meat such as pork, lamb or beef may be delayed, occurring 3-8 hours after eating. Most food allergies are directed against a protein molecule, but alpha-gal is unusual because it is a carbohydrate, and a delay in its absorption may explain the delay in symptoms.  What are the symptoms of an alpha-gal allergy? As with other food allergies, signs or symptoms of an allergy to alpha-gal may include: . Hives and itching  . Swelling of your lips, face or eyelids  . Shortness of breath, cough or wheezing  . Abdominal pain, nausea, diarrhea or vomiting The most severe reaction, anaphylaxis, can present as  a combination of several of these symptoms, may include low blood pressure, and is potentially fatal.  Because these symptoms are delayed, you may only wake up with them in the middle of the night after an evening meal.  How is an alpha-gal allergy diagnosed? Diagnosis of this allergy starts with your allergist taking an appropriate history and physical examination. Because the onset is usually quite delayed, it can be hard to associate the symptoms with eating red meat many hours previously. Triggers include any red meat - including beef, pork, lamb or even horse products. It may occur after eating hotdogs and hamburgers. In very rare cases the reaction may extend to milk or dairy proteins and gelatin.  Your allergist may recommend testing that includes skin tests to the relevant animal proteins and blood tests which measure the levels of a specific immunoglobulin E (IgE) antibody, to mammalian meats. An investigational blood test, IgE against alpha-gal itself, may also aid in the diagnosis.  How is an alpha-gal allergy treated? Immediate symptoms such as hives or shortness of breath are treated the same as any other food allergy - in an urgent care setting with anti-histamines, epinephrine and other medications. Prevention long-term involves avoidance of all red meat in sensitized individuals. You may be advised to carry an epinephrine auto-injector, to be used in case of subsequent accidental exposures and reaction. These measures do not necessarily mean switching to a full vegetarian diet, since poultry and fish can be consumed and do not cause similar reactions. As with  other food allergies, there is the possibility that over time the sensitivity diminishes - although these changes may take many years to become apparent.  How do you become allergic to alpha-gal? Alpha-gal is a molecule carried in the saliva of the [place] tick and other potential arthropods typically after feeding on mammalian  blood. People that are bitten by the tick, especially those that are bitten repeatedly, are at risk of becoming sensitized and producing the IgE necessary to then cause allergic reactions. Interestingly, allergic reactions may occur to red meat, to subsequent tick bites, and even to medications that contain alpha-gal. Cetuximab is a cancer medication that contains alpha-gal, and people who have had allergic reactions to this medication (these are typically immediate reactions, because it is infused intravenously) have a higher risk for red meat allergy and are likely to have been bitten by ticks in the past. As might be expected, the incidence of tick bites is much higher in the [place] and [place] the traditional habitat for the tick. However, cases are now increasingly reported in the [place] and [place] states. And it is a phenomenon that has been observed worldwide, with different ticks responsible for similar cases of red meat allergy in many other countries such as [place], [place] and [place].  The discovery of this peculiar allergy has allowed researchers to correlate tick bites with many cases of anaphylaxis that would previously have been classified as 'idiopathic', or of unknown cause. Also, while it was originally thought that the [employer] tick had to feast on mammalian blood in order to carry the alpha-gal molecule, more recent research has shown that it may carry this molecule and be capable of sensitizing humans independently.  How do you prevent an alpha-gal allergy? Because this allergy is predominantly tick born, you are more likely at risk if you often go outdoors in wooded areas for activities such as hiking, fishing or hunting. The key strategy is to prevent tick bites. This may include wearing long sleeved shirts or pants, using appropriate insect repellants, and surveying for ticks after spending time outdoors. Any observed ticks should be removed carefully by cleaning the  site with rubbing alcohol, then using tweezers to pull the tick's head up carefully from the skin using steady pressure. Clean your hands and the site one more time and make sure not to crush the tick between your fingers.

## 2019-06-11 ENCOUNTER — Encounter: Payer: Self-pay | Admitting: Allergy

## 2019-06-16 DIAGNOSIS — C61 Malignant neoplasm of prostate: Secondary | ICD-10-CM | POA: Diagnosis not present

## 2019-06-30 DIAGNOSIS — L821 Other seborrheic keratosis: Secondary | ICD-10-CM | POA: Diagnosis not present

## 2019-06-30 DIAGNOSIS — L918 Other hypertrophic disorders of the skin: Secondary | ICD-10-CM | POA: Diagnosis not present

## 2019-06-30 DIAGNOSIS — D225 Melanocytic nevi of trunk: Secondary | ICD-10-CM | POA: Diagnosis not present

## 2019-06-30 DIAGNOSIS — D2261 Melanocytic nevi of right upper limb, including shoulder: Secondary | ICD-10-CM | POA: Diagnosis not present

## 2019-07-02 ENCOUNTER — Other Ambulatory Visit: Payer: Self-pay | Admitting: Nurse Practitioner

## 2019-07-06 DIAGNOSIS — C61 Malignant neoplasm of prostate: Secondary | ICD-10-CM | POA: Diagnosis not present

## 2019-08-05 DIAGNOSIS — Z20828 Contact with and (suspected) exposure to other viral communicable diseases: Secondary | ICD-10-CM | POA: Diagnosis not present

## 2019-08-05 DIAGNOSIS — J309 Allergic rhinitis, unspecified: Secondary | ICD-10-CM | POA: Diagnosis not present

## 2019-08-20 DIAGNOSIS — J309 Allergic rhinitis, unspecified: Secondary | ICD-10-CM | POA: Diagnosis not present

## 2019-08-20 DIAGNOSIS — Z20828 Contact with and (suspected) exposure to other viral communicable diseases: Secondary | ICD-10-CM | POA: Diagnosis not present

## 2019-09-07 ENCOUNTER — Other Ambulatory Visit: Payer: Self-pay

## 2019-09-07 ENCOUNTER — Ambulatory Visit (INDEPENDENT_AMBULATORY_CARE_PROVIDER_SITE_OTHER)
Admission: RE | Admit: 2019-09-07 | Discharge: 2019-09-07 | Disposition: A | Discharge status: Home / Self Care | Payer: BC Managed Care – PPO | Source: Ambulatory Visit | Attending: Nurse Practitioner | Admitting: Nurse Practitioner

## 2019-09-07 ENCOUNTER — Encounter (INDEPENDENT_AMBULATORY_CARE_PROVIDER_SITE_OTHER): Payer: Self-pay

## 2019-09-07 DIAGNOSIS — R911 Solitary pulmonary nodule: Secondary | ICD-10-CM | POA: Diagnosis not present

## 2019-09-07 DIAGNOSIS — J432 Centrilobular emphysema: Secondary | ICD-10-CM | POA: Diagnosis not present

## 2019-09-08 ENCOUNTER — Ambulatory Visit: Payer: BLUE CROSS/BLUE SHIELD | Admitting: Nurse Practitioner

## 2019-09-09 NOTE — Progress Notes (Signed)
CARDIOLOGY OFFICE NOTE  Date:  09/15/2019    James Weeks Date of Birth: 04-Aug-1960 Medical Record [id number]  PCP:  [name], MD (Inactive)  Cardiologist:  Servando Snare    Chief Complaint  Patient presents with  . Follow-up    1 year check.     History of Present Illness: James Weeks is a 59 y.o. male who presents today for a one year check. He follows with me. I am an acquaintance of his wife's.  He has a history ofHTN, HLD, elevated PSA, elevated blood sugar, obesity and anxiety. Looks to have had past intolerance to HCTZ due to gynecomastia.FH is quite + for CAD. He is a smoker- has stopped cigarettes and now smokes cigars.  I saw him last year - has had long standing HTN. Had a spell of presyncope about 10 years prior - negative stress test and echo at that time - BP very high and he was started on antihypertensive regimen. He has had his HCTZ stopped due to gynecomastia. He self referred himself here for DOE and chest tightness. BP still not controlled. Norvasc was added. Coronary CT obtained. To consider sleep study but he did not really seem to have any issues. Had stopped exercising. Has had elevated PSA - placed on antibiotics and was to be having prostate biopsy.   The patient does not have symptoms concerning for COVID-19 infection (fever, chills, cough, or new shortness of breath).   Comes in today. Here alone. He is doing ok. Feels pretty good. No chest pain reported. He notes he is primarily working remotely from home now due to the pandemic. To get first vaccine hopefully next week. No palpitations. Not dizzy or lightheaded. Energy level is felt to be ok - notes it is "different" but he attributes this to being at home". His ARB has been stopped due to the alpha gal allergy. He has stopped red meat - feels better overall with this. Toprol was added to his Norvasc. BP is fair. He is checking at home. We reviewed his most recent CT. Understands  that I would favor repeating in one year. He has occasional alcohol use - does not sound excessive. He is trying to work on his weight.   Past Medical History:  Diagnosis Date  . Allergic rhinitis due to pollen   . Angina of effort ([place])   . Anxiety   . Body mass index 34.0-34.9, adult   . Elevated blood sugar   . Hemoptysis   . Hyperlipidemia   . Hypertension   . Nodule of skin of breast   . Obese   . PSA elevation     Past Surgical History:  Procedure Laterality Date  . NO PAST SURGERIES       Medications: Current Meds  Medication Sig  . amLODipine (NORVASC) 5 MG tablet TAKE 1 TABLET BY MOUTH EVERYDAY AT BEDTIME  . aspirin 81 MG EC tablet Take 81 mg by mouth daily. Swallow whole.  . diazepam (VALIUM) 10 MG tablet Take 5 mg by mouth every morning. anxiety  . EPINEPHrine 0.3 mg/0.3 mL IJ SOAJ injection Inject 1 mg into the muscle as directed.  Marland Kitchen FLUTICASONE PROPIONATE NA Place 50 mcg into the nose as needed (seasonal allergies).   . loratadine (CLARITIN) 10 MG tablet Take 20 mg by mouth daily.  . metoprolol succinate (TOPROL-XL) 50 MG 24 hr tablet daily.   . tamsulosin (FLOMAX) 0.4 MG CAPS capsule Take 0.4 mg by mouth  daily after supper.      Allergies: Allergies  Allergen Reactions  . Alpha-Gal Hives    Social History: The patient  reports that he has been smoking cigars. He has been smoking about 0.50 packs per day. He has never used smokeless tobacco. He reports current alcohol use.   Family History: The patient's family history includes Asthma in his brother and father; CVA in an other family member; Diabetes in his mother; Heart attack in an other family member; Heart disease in his father and another family member; Hypertension in his mother and another family member.   Review of Systems: Please see the history of present illness.   All other systems are reviewed and negative.   Physical Exam: VS:  BP (!) 136/96   Pulse 88   Ht 6' (1.829 m)   Wt 271 lb  12.8 oz (123.3 kg)   SpO2 97%   BMI 36.86 kg/m  .  BMI Body mass index is 36.86 kg/m.  Wt Readings from Last 3 Encounters:  09/15/19 271 lb 12.8 oz (123.3 kg)  06/09/19 268 lb (121.6 kg)  09/08/18 267 lb 12.8 oz (121.5 kg)    General: Pleasant. Alert and in no acute distress. He remains obese.   HEENT: Normal.  Neck: Supple, no JVD, carotid bruits, or masses noted.  Cardiac: Irregular irregular rhythm. His rate is ok. No murmur. No edema.  Respiratory:  Lungs are clear to auscultation bilaterally with normal work of breathing.  GI: Soft and nontender.  MS: No deformity or atrophy. Gait and ROM intact.  Skin: Warm and dry. Color is normal.  Neuro:  Strength and sensation are intact and no gross focal deficits noted.  Psych: Alert, appropriate and with normal affect.   LABORATORY DATA:  EKG:  EKG is ordered today. It has been repeated twice and is personally reviewed with me This demonstrates AF with controlled VR - reviewed with Dr. Irish Lack also.  No results found for: WBC, HGB, HCT, PLT, GLUCOSE, CHOL, TRIG, HDL, LDLDIRECT, LDLCALC, ALT, AST, NA, K, CL, CREATININE, BUN, CO2, TSH, PSA, INR, GLUF, HGBA1C, MICROALBUR      BNP (last 3 results) No results for input(s): BNP in the last 8760 hours.  ProBNP (last 3 results) No results for input(s): PROBNP in the last 8760 hours.   Other Studies Reviewed Today:   CHEST CT IMPRESSION 08/2019: 1. Unchanged 4 mm nodule in the left upper lobe, benign. 2. 4 mm nodule in the right upper lobe, not included in the field of view on the prior study. No follow-up needed if patient is low-risk. Non-contrast chest CT can be considered in 12 months if patient is high-risk. This recommendation follows the consensus statement: Guidelines for Management of Incidental Pulmonary Nodules Detected on CT Images: From the Fleischner Society 2017; Radiology 2017; 284:228-243. 3.  Emphysema (ICD10-J43.9). 4.  Aortic atherosclerosis (ICD10-I70.0).    Electronically Signed   By: Titus Dubin M.D.   On: 09/07/2019 09:18   CORONARY CT FINDINGS 08/2018: Coronary calcium score: The patient's coronary artery calcium score is 1, which places the patient in the 35 percentile.  Coronary arteries: Normal coronary origins. Right dominance.  Moderately tortuous coronary arteries.  Right Coronary Artery: No detectable plaque or stenosis.  Left Main Coronary Artery: No detectable plaque or stenosis.  Left Anterior Descending Coronary Artery: Mild mixed atherosclerotic plaque in the mid LAD (25-49% stenosis), otherwise no significant stenosis.  Left Circumflex Artery: No detectable plaque or stenosis.  Aorta: Normal size,  36 mm at mid ascending aorta (level of PA bifurcation, double oblique measurement). No calcifications. No dissection.  Aortic Valve: No calcifications.  Other findings:  Normal pulmonary vein drainage into the left atrium.  Normal left atrial appendage without a thrombus.  Borderline enlargement of main pulmonary artery, approximately 30 mm.  IMPRESSION: 1. Mild CAD of mid LAD, CADRADS = 2. Moderately tortuous coronary arteries.  2. The patient's coronary artery calcium score is 1, which places the patient in the 62 percentile for age and sex matched control.  3. Normal coronary origin with right dominance.   Electronically Signed By: Cherlynn Kaiser On: 09/05/2018 12:35   FINDINGS: Limited view of the lung parenchyma demonstrates 4 mm nodule in the LEFT upper lobe (image 7/12). Airways are normal.  Limited view of the mediastinum demonstrates no adenopathy. Esophagus normal.  Limited view of the upper abdomen unremarkable.  Limited view of the skeleton and chest wall is unremarkable.  IMPRESSION: LEFT upper lobe noncalcified pulmonary nodule. No follow-up needed if patient is low-risk. Non-contrast chest CT can be considered in 12 months if patient is  high-risk. This recommendation follows the consensus statement: Guidelines for Management of Incidental Pulmonary Nodules Detected on CT Images: From the Fleischner Society 2017; Radiology 2017; 284:228-243.  These results will be called to the ordering clinician or representative by the Radiologist Assistant, and communication documented in the PACS or zVision Dashboard.  Electronically Signed: By: Suzy Bouchard M.D. On: 09/05/2018 09:10   Assessment/Plan:  1. New onset AF - controlled VR - suspect his rate is ok due to being on beta blocker - CHADSVASC is 2 (HTN/CAD) - would favor getting lab today, getting echocardiogram to look for structural issues, starting Eliquis 5 mg BID and consider cardioversion in a month if this persists. He is not really symptomatic but explained that we would like to give one attempt at restoration back to NSR. Discussed plan of care with Dr. Irish Lack (DOD) and he is agreeable with this plan of care.   2. Prior chest tightness - this has not recurred.   3. CAD - has had prior coronary CT with mild disease noted - needs aggressive CV risk factor modification  4. Lung nodule - most recent scan is reviewed with him - would favor repeating this in one year.   5. HTN - fair control - he is monitoring at home - no ARB due to his alpha gal allergy  6. Occasional cigar smoking - total cessation encouraged.   7. HLD - he has not wanted to be on statin - needs fasting labs  8. Obesity - we will need to consider a sleep study  9. Prostate cancer - followed by urology.   10. COVID-19 Education: The signs and symptoms of COVID-19 were discussed with the patient and how to seek care for testing (follow up with PCP or arrange E-visit).  The importance of social distancing, staying at home, hand hygiene and wearing a mask when out in public were discussed today. He is planning on having his first vaccine next week.   Current medicines are reviewed with the  patient today.  The patient does not have concerns regarding medicines other than what has been noted above.  The following changes have been made:  See above.  Labs/ tests ordered today include:    Orders Placed This Encounter  Procedures  . Basic metabolic panel  . CBC  . TSH  . EKG 12-Lead  . ECHOCARDIOGRAM COMPLETE  Disposition:   FU with me in one month. EKG on return. Lab today. Arrange echo. Starting Eliquis 5 mg BID.    Patient is agreeable to this plan and will call if any problems develop in the interim.   SignedTruitt Merle, NP  09/15/2019 4:49 PM  [facility] [address] [place], [place]  [address] Phone: [phone] Fax: [phone]

## 2019-09-15 ENCOUNTER — Other Ambulatory Visit: Payer: Self-pay

## 2019-09-15 ENCOUNTER — Ambulatory Visit: Payer: BC Managed Care – PPO | Admitting: Nurse Practitioner

## 2019-09-15 ENCOUNTER — Encounter: Payer: Self-pay | Admitting: Nurse Practitioner

## 2019-09-15 VITALS — BP 136/96 | HR 88 | Ht 72.0 in | Wt 271.8 lb

## 2019-09-15 DIAGNOSIS — I10 Essential (primary) hypertension: Secondary | ICD-10-CM

## 2019-09-15 DIAGNOSIS — Z7189 Other specified counseling: Secondary | ICD-10-CM

## 2019-09-15 DIAGNOSIS — I4891 Unspecified atrial fibrillation: Secondary | ICD-10-CM

## 2019-09-15 DIAGNOSIS — E7849 Other hyperlipidemia: Secondary | ICD-10-CM

## 2019-09-15 MED ORDER — APIXABAN 5 MG PO TABS: 5.0000 mg | ORAL_TABLET | Freq: Two times a day (BID) | ORAL | 11 refills | Status: DC

## 2019-09-15 NOTE — Patient Instructions (Addendum)
After Visit Summary:  We will be checking the following labs today - BMET, TSH & CBC  Medication Instructions:    Continue with your current medicines. BUT  I am adding Eliquis 5 mg to take twice a day - start with the samples   If you need a refill on your cardiac medications before your next appointment, please call your pharmacy.     Testing/Procedures To Be Arranged:  Echocardiogram  Follow-Up:   See me in one month - we will repeat the EKG - if still in atrial fibrillation - we will plan for a cardioversion (shock your heart).     At [facility], you and your health needs are our priority.  As part of our continuing mission to provide you with exceptional heart care, we have created designated Provider Care Teams.  These Care Teams include your primary Cardiologist (physician) and Advanced Practice Providers (APPs -  Physician Assistants and Nurse Practitioners) who all work together to provide you with the care you need, when you need it.  Special Instructions:  . Stay safe, stay home, wash your hands for at least 20 seconds and wear a mask when out in public.  . It was good to talk with you today.    Call the [place] office at [phone] if you have any questions, problems or concerns.    Atrial Fibrillation  Atrial fibrillation is a type of heartbeat that is irregular or fast. If you have this condition, your heart beats without any order. This makes it hard for your heart to pump blood in a normal way. Atrial fibrillation may come and go, or it may become a long-lasting problem. If this condition is not treated, it can put you at higher risk for stroke, heart failure, and other heart problems. What are the causes? This condition may be caused by diseases that damage the heart. They include:  High blood pressure.  Heart failure.  Heart valve disease.  Heart surgery. Other causes include:  Diabetes.  Thyroid  disease.  Being overweight.  Kidney disease. Sometimes the cause is not known. What increases the risk? You are more likely to develop this condition if:  You are older.  You smoke.  You exercise often and very hard.  You have a family history of this condition.  You are a man.  You use drugs.  You drink a lot of alcohol.  You have lung conditions, such as emphysema, pneumonia, or COPD.  You have sleep apnea. What are the signs or symptoms? Common symptoms of this condition include:  A feeling that your heart is beating very fast.  Chest pain or discomfort.  Feeling short of breath.  Suddenly feeling light-headed or weak.  Getting tired easily during activity.  Fainting.  Sweating. In some cases, there are no symptoms. How is this treated? Treatment for this condition depends on underlying conditions and how you feel when you have atrial fibrillation. They include:  Medicines to: ? Prevent blood clots. ? Treat heart rate or heart rhythm problems.  Using devices, such as a pacemaker, to correct heart rhythm problems.  Doing surgery to remove the part of the heart that sends bad signals.  Closing an area where clots can form in the heart (left atrial appendage). In some cases, your doctor will treat other underlying conditions. Follow these instructions at home: Medicines  Take over-the-counter and prescription medicines only as told by your doctor.  Do not take any new  medicines without first talking to your doctor.  If you are taking blood thinners: ? Talk with your doctor before you take any medicines that have aspirin or NSAIDs, such as ibuprofen, in them. ? Take your medicine exactly as told by your doctor. Take it at the same time each day. ? Avoid activities that could hurt or bruise you. Follow instructions about how to prevent falls. ? Wear a bracelet that says you are taking blood thinners. Or, carry a card that lists what medicines you  take. Lifestyle      Do not use any products that have nicotine or tobacco in them. These include cigarettes, e-cigarettes, and chewing tobacco. If you need help quitting, ask your doctor.  Eat heart-healthy foods. Talk with your doctor about the right eating plan for you.  Exercise regularly as told by your doctor.  Do not drink alcohol.  Lose weight if you are overweight.  Do not use drugs, including cannabis. General instructions  If you have a condition that causes breathing to stop for a short period of time (apnea), treat it as told by your doctor.  Keep a healthy weight. Do not use diet pills unless your doctor says they are safe for you. Diet pills may make heart problems worse.  Keep all follow-up visits as told by your doctor. This is important. Contact a doctor if:  You notice a change in the speed, rhythm, or strength of your heartbeat.  You are taking a blood-thinning medicine and you get more bruising.  You get tired more easily when you move or exercise.  You have a sudden change in weight. Get help right away if:   You have pain in your chest or your belly (abdomen).  You have trouble breathing.  You have side effects of blood thinners, such as blood in your vomit, poop (stool), or pee (urine), or bleeding that cannot stop.  You have any signs of a stroke. "BE FAST" is an easy way to remember the main warning signs: ? B - Balance. Signs are dizziness, sudden trouble walking, or loss of balance. ? E - Eyes. Signs are trouble seeing or a change in how you see. ? F - Face. Signs are sudden weakness or loss of feeling in the face, or the face or eyelid drooping on one side. ? A - Arms. Signs are weakness or loss of feeling in an arm. This happens suddenly and usually on one side of the body. ? S - Speech. Signs are sudden trouble speaking, slurred speech, or trouble understanding what people say. ? T - Time. Time to call emergency services. Write down what  time symptoms started.  You have other signs of a stroke, such as: ? A sudden, very bad headache with no known cause. ? Feeling like you may vomit (nausea). ? Vomiting. ? A seizure. These symptoms may be an emergency. Do not wait to see if the symptoms will go away. Get medical help right away. Call your local emergency services (911 in the [place] Do not drive yourself to the hospital. Summary  Atrial fibrillation is a type of heartbeat that is irregular or fast.  You are at higher risk of this condition if you smoke, are older, have diabetes, or are overweight.  Follow your doctor's instructions about medicines, diet, exercise, and follow-up visits.  Get help right away if you have signs or symptoms of a stroke.  Get help right away if you cannot catch your breath, or you have  chest pain or discomfort. This information is not intended to replace advice given to you by your health care provider. Make sure you discuss any questions you have with your health care provider. Document Revised: 12/03/2018 Document Reviewed: 12/03/2018 Elsevier Patient Education  [place].

## 2019-09-16 LAB — BASIC METABOLIC PANEL
BUN/Creatinine Ratio: 12 (ref 9–20)
BUN: 12 mg/dL (ref 6–24)
CO2: 25 mmol/L (ref 20–29)
Calcium: 9.4 mg/dL (ref 8.7–10.2)
Chloride: 99 mmol/L (ref 96–106)
Creatinine, Ser: 1.03 mg/dL (ref 0.76–1.27)
GFR calc Af Amer: 91 mL/min/{1.73_m2} (ref >59)
GFR calc non Af Amer: 79 mL/min/{1.73_m2} (ref >59)
Glucose: 90 mg/dL (ref 65–99)
Potassium: 3.8 mmol/L (ref 3.5–5.2)
Sodium: 137 mmol/L (ref 134–144)

## 2019-09-16 LAB — CBC
Hematocrit: 49.1 % (ref 37.5–51.0)
Hemoglobin: 17.3 g/dL (ref 13.0–17.7)
MCH: 31.4 pg (ref 26.6–33.0)
MCHC: 35.2 g/dL (ref 31.5–35.7)
MCV: 89 fL (ref 79–97)
Platelets: 252 10*3/uL (ref 150–450)
RBC: 5.51 x10E6/uL (ref 4.14–5.80)
RDW: 13.4 % (ref 11.6–15.4)
WBC: 9.8 10*3/uL (ref 3.4–10.8)

## 2019-09-16 LAB — TSH: TSH: 1.85 u[IU]/mL (ref 0.450–4.500)

## 2019-09-23 ENCOUNTER — Ambulatory Visit (HOSPITAL_COMMUNITY): Payer: BC Managed Care – PPO | Attending: Cardiovascular Disease

## 2019-09-23 ENCOUNTER — Other Ambulatory Visit: Payer: Self-pay

## 2019-09-23 ENCOUNTER — Telehealth: Payer: Self-pay | Admitting: Nurse Practitioner

## 2019-09-23 DIAGNOSIS — E7849 Other hyperlipidemia: Secondary | ICD-10-CM | POA: Diagnosis not present

## 2019-09-23 DIAGNOSIS — I4891 Unspecified atrial fibrillation: Secondary | ICD-10-CM

## 2019-09-23 DIAGNOSIS — I10 Essential (primary) hypertension: Secondary | ICD-10-CM

## 2019-09-23 MED ORDER — PERFLUTREN LIPID MICROSPHERE
1.0000 mL | INTRAVENOUS | Status: AC | PRN
  Administered 2019-09-23: 2 mL via INTRAVENOUS

## 2019-09-23 NOTE — Telephone Encounter (Signed)
James Weeks is returning James Weeks's call in regards to results.

## 2019-10-03 DIAGNOSIS — Z23 Encounter for immunization: Secondary | ICD-10-CM | POA: Diagnosis not present

## 2019-10-06 DIAGNOSIS — C61 Malignant neoplasm of prostate: Secondary | ICD-10-CM | POA: Diagnosis not present

## 2019-10-06 NOTE — H&P (View-Only) (Signed)
CARDIOLOGY OFFICE NOTE  Date:  10/13/2019    James Weeks Date of Birth: 1960-11-28 Medical Record [id number]  PCP:  [name], MD (Inactive)  Cardiologist:  Servando Snare     Chief Complaint  Patient presents with  . Follow-up    History of Present Illness: James Weeks is a 59 y.o. male who presents today for a one month check.  He follows with me.I am an acquaintance of his wife's.  He has a history ofHTN, HLD, elevated PSA, elevated blood sugar, obesity and anxiety. Looks to have had past intolerance to HCTZ due to gynecomastia.FH is quite + for CAD. He is a smoker- has stopped cigarettes and now smokes cigars.  I saw him last year - has had long standing HTN. Had a spell of presyncope about 10 years prior - negative stress test and echo at that time - BP very high and he was started on antihypertensive regimen. He has had his HCTZ stopped due to gynecomastia. He self referred himself here for DOE and chest tightness. BP still not controlled. Norvasc was added. Coronary CT obtained. To consider sleep study but he did not really seem to have any issues. Had stopped exercising. Has had elevated PSA - placed on antibiotics and was to be having prostate biopsy.  Last seen a month ago - was doing ok - getting first vaccine. ARB stopped due to alpha gal allergy. Toprol had been added to his regimen. BP fair. He was noted to be in AF at that visit - he was totally asymptomatic - Eliquis was started - echo arranged and plan for cardioversion after a month of anticoagulation.   The patient does not have symptoms concerning for COVID-19 infection (fever, chills, cough, or new shortness of breath).   Comes in today. Here alone. Notes more fatigue. Has lots of joint issues now that he is not able to take NSAID. No missed doses of his Eliquis. No real palpitations. Not dizzy or lightheaded. Some snoring noted - has never had sleep study. Seems fairly rested in the  morning. No chest pain. He has had one vaccine. He would like to proceed on with cardioversion.   Past Medical History:  Diagnosis Date  . Allergic rhinitis due to pollen   . Angina of effort ([place])   . Anxiety   . Body mass index 34.0-34.9, adult   . Elevated blood sugar   . Hemoptysis   . Hyperlipidemia   . Hypertension   . Nodule of skin of breast   . Obese   . PSA elevation     Past Surgical History:  Procedure Laterality Date  . NO PAST SURGERIES       Medications: Current Meds  Medication Sig  . amLODipine (NORVASC) 5 MG tablet TAKE 1 TABLET BY MOUTH EVERYDAY AT BEDTIME  . apixaban (ELIQUIS) 5 MG TABS tablet Take 1 tablet (5 mg total) by mouth 2 (two) times daily.  Marland Kitchen aspirin 81 MG EC tablet Take 81 mg by mouth daily. Swallow whole.  . diazepam (VALIUM) 10 MG tablet Take 5 mg by mouth as needed for anxiety.   Marland Kitchen EPINEPHrine 0.3 mg/0.3 mL IJ SOAJ injection Inject 1 mg into the muscle as directed.  Marland Kitchen FLUTICASONE PROPIONATE NA Place 50 mcg into the nose as needed (seasonal allergies).   . loratadine (CLARITIN) 10 MG tablet Take 20 mg by mouth daily.  . metoprolol succinate (TOPROL-XL) 50 MG 24 hr tablet daily.   Marland Kitchen  tamsulosin (FLOMAX) 0.4 MG CAPS capsule Take 0.4 mg by mouth daily after supper.      Allergies: Allergies  Allergen Reactions  . Alpha-Gal Hives    Social History: The patient  reports that he has been smoking cigars. He has been smoking about 0.50 packs per day. He has never used smokeless tobacco. He reports current alcohol use.   Family History: The patient's family history includes Asthma in his brother and father; CVA in an other family member; Diabetes in his mother; Heart attack in an other family member; Heart disease in his father and another family member; Hypertension in his mother and another family member.   Review of Systems: Please see the history of present illness.   All other systems are reviewed and negative.   Physical Exam: VS:  BP  138/82   Pulse 92   Ht 6' (1.829 m)   Wt 268 lb 6.4 oz (121.7 kg)   SpO2 98%   BMI 36.40 kg/m  .  BMI Body mass index is 36.4 kg/m.  Wt Readings from Last 3 Encounters:  10/13/19 268 lb 6.4 oz (121.7 kg)  09/15/19 271 lb 12.8 oz (123.3 kg)  06/09/19 268 lb (121.6 kg)    General: Pleasant. Well developed, well nourished and in no acute distress.   HEENT: Normal.  Neck: Supple, no JVD, carotid bruits, or masses noted.  Cardiac: Irregular irregular rhythm. No murmurs, rubs, or gallops. No edema.  Respiratory:  Lungs are clear to auscultation bilaterally with normal work of breathing.  GI: Soft and nontender.  MS: No deformity or atrophy. Gait and ROM intact.  Skin: Warm and dry. Color is normal.  Neuro:  Strength and sensation are intact and no gross focal deficits noted.  Psych: Alert, appropriate and with normal affect.   LABORATORY DATA:  EKG:  EKG is ordered today.  Personally reviewed by me. This demonstrates AF with controlled VR - 92.  Lab Results  Component Value Date   WBC 9.8 09/15/2019   HGB 17.3 09/15/2019   HCT 49.1 09/15/2019   PLT 252 09/15/2019   GLUCOSE 90 09/15/2019   NA 137 09/15/2019   K 3.8 09/15/2019   CL 99 09/15/2019   CREATININE 1.03 09/15/2019   BUN 12 09/15/2019   CO2 25 09/15/2019   TSH 1.850 09/15/2019       BNP (last 3 results) No results for input(s): BNP in the last 8760 hours.  ProBNP (last 3 results) No results for input(s): PROBNP in the last 8760 hours.   Other Studies Reviewed Today:  ECHO IMPRESSIONS 08/2019  1. Left ventricular ejection fraction, by estimation, is 50 to 55%. The  left ventricle has low normal function. The left ventricle has no regional  wall motion abnormalities. Left ventricular diastolic function could not  be evaluated.  2. Right ventricular systolic function is normal. The right ventricular  size is normal. The estimated right ventricular systolic pressure is 99991111  mmHg.  3. Left atrial  size was mildly dilated.  4. The mitral valve is normal in structure. Trivial mitral valve  regurgitation. No evidence of mitral stenosis.  5. The aortic valve is normal in structure. Aortic valve regurgitation is  not visualized. Mild aortic valve sclerosis is present, with no evidence  of aortic valve stenosis.  6. The inferior vena cava is normal in size with greater than 50%  respiratory variability, suggesting right atrial pressure of 3 mmHg.    CHEST CT IMPRESSION 08/2019: 1. Unchanged 4  mm nodule in the left upper lobe, benign. 2. 4 mm nodule in the right upper lobe, not included in the field of view on the prior study. No follow-up needed if patient is low-risk. Non-contrast chest CT can be considered in 12 months if patient is high-risk. This recommendation follows the consensus statement: Guidelines for Management of Incidental Pulmonary Nodules Detected on CT Images: From the Fleischner Society 2017; Radiology 2017; 284:228-243. 3. Emphysema (ICD10-J43.9). 4. Aortic atherosclerosis (ICD10-I70.0).   Electronically Signed By: Titus Dubin M.D. On: 09/07/2019 09:18   CORONARY CT FINDINGS 08/2018: Coronary calcium score: The patient's coronary artery calcium score is 1, which places the patient in the 35 percentile.  Coronary arteries: Normal coronary origins. Right dominance.  Moderately tortuous coronary arteries.  Right Coronary Artery: No detectable plaque or stenosis.  Left Main Coronary Artery: No detectable plaque or stenosis.  Left Anterior Descending Coronary Artery: Mild mixed atherosclerotic plaque in the mid LAD (25-49% stenosis), otherwise no significant stenosis.  Left Circumflex Artery: No detectable plaque or stenosis.  Aorta: Normal size, 36 mm at mid ascending aorta (level of PA bifurcation, double oblique measurement). No calcifications. No dissection.  Aortic Valve: No calcifications.  Other findings:  Normal  pulmonary vein drainage into the left atrium.  Normal left atrial appendage without a thrombus.  Borderline enlargement of main pulmonary artery, approximately 30 mm.  IMPRESSION: 1. Mild CAD of mid LAD, CADRADS = 2. Moderately tortuous coronary arteries.  2. The patient's coronary artery calcium score is 1, which places the patient in the 86 percentile for age and sex matched control.  3. Normal coronary origin with right dominance.   Electronically Signed By: Cherlynn Kaiser On: 09/05/2018 12:35   FINDINGS: Limited view of the lung parenchyma demonstrates 4 mm nodule in the LEFT upper lobe (image 7/12). Airways are normal.  Limited view of the mediastinum demonstrates no adenopathy. Esophagus normal.  Limited view of the upper abdomen unremarkable.  Limited view of the skeleton and chest wall is unremarkable.  IMPRESSION: LEFT upper lobe noncalcified pulmonary nodule. No follow-up needed if patient is low-risk. Non-contrast chest CT can be considered in 12 months if patient is high-risk. This recommendation follows the consensus statement: Guidelines for Management of Incidental Pulmonary Nodules Detected on CT Images: From the Fleischner Society 2017; Radiology 2017; 284:228-243.  These results will be called to the ordering clinician or representative by the Radiologist Assistant, and communication documented in the PACS or zVision Dashboard.  Electronically Signed: By: Suzy Bouchard M.D. On: 09/05/2018 09:10   Assessment/Plan:  1. New onset AF - VR is fairly controlled - he is on Eliquis - no missed doses - echo updated - some LAE noted - this may make restoration a little more difficult - would consider EP referral to Dr. Rayann Heman for consideration of ablation/AAD therapy if fails to convert. Cardioversion arranged for May 7h. Labs and COVID testing May 5th. Procedure has been reviewed and he is willing to proceed.   2. HTN - BP is  ok.   3. CAD - prior coronary CT with mild disease noted - managed medically - no symptoms.   4. Possible OSA - will arrange for sleep study  5. Lung nodule - needs repeat scan in one year - not discussed today.   6. Occasional cigar smoking - total cessation encouraged.   7. HLD   8. Obesity   9. Prostate cancer - followed by urology. Recent check was ok - for repeat in 3 months.  10. COVID-19 Education: The signs and symptoms of COVID-19 were discussed with the patient and how to seek care for testing (follow up with PCP or arrange E-visit).  The importance of social distancing, staying at home, hand hygiene and wearing a mask when out in public were discussed today. He has had one vaccine.   Current medicines are reviewed with the patient today.  The patient does not have concerns regarding medicines other than what has been noted above.  The following changes have been made:  See above.  Labs/ tests ordered today include:    Orders Placed This Encounter  Procedures  . Basic metabolic panel  . CBC  . PT and PTT  . EKG 12-Lead     Disposition:   FU with me 2 weeks post cardioversion.    Patient is agreeable to this plan and will call if any problems develop in the interim.   SignedTruitt Merle, NP  10/13/2019 10:00 AM  [facility] [address] [place], [place]  [address] Phone: [phone] Fax: [phone]

## 2019-10-06 NOTE — Progress Notes (Signed)
CARDIOLOGY OFFICE NOTE  Date:  10/13/2019    James Weeks Date of Birth: [birth year] Medical Record [id number]  PCP:  [name], MD (Inactive)  Cardiologist:  Servando Snare     Chief Complaint  Patient presents with  . Follow-up    History of Present Illness: James Weeks is a 59 y.o. male who presents today for a one month check.  He follows with me.I am an acquaintance of his wife's.  He has a history ofHTN, HLD, elevated PSA, elevated blood sugar, obesity and anxiety. Looks to have had past intolerance to HCTZ due to gynecomastia.FH is quite + for CAD. He is a smoker- has stopped cigarettes and now smokes cigars.  I saw him last year - has had long standing HTN. Had a spell of presyncope about 10 years prior - negative stress test and echo at that time - BP very high and he was started on antihypertensive regimen. He has had his HCTZ stopped due to gynecomastia. He self referred himself here for DOE and chest tightness. BP still not controlled. Norvasc was added. Coronary CT obtained. To consider sleep study but he did not really seem to have any issues. Had stopped exercising. Has had elevated PSA - placed on antibiotics and was to be having prostate biopsy.  Last seen a month ago - was doing ok - getting first vaccine. ARB stopped due to alpha gal allergy. Toprol had been added to his regimen. BP fair. He was noted to be in AF at that visit - he was totally asymptomatic - Eliquis was started - echo arranged and plan for cardioversion after a month of anticoagulation.   The patient does not have symptoms concerning for COVID-19 infection (fever, chills, cough, or new shortness of breath).   Comes in today. Here alone. Notes more fatigue. Has lots of joint issues now that he is not able to take NSAID. No missed doses of his Eliquis. No real palpitations. Not dizzy or lightheaded. Some snoring noted - has never had sleep study. Seems fairly rested in the  morning. No chest pain. He has had one vaccine. He would like to proceed on with cardioversion.   Past Medical History:  Diagnosis Date  . Allergic rhinitis due to pollen   . Angina of effort ([place])   . Anxiety   . Body mass index 34.0-34.9, adult   . Elevated blood sugar   . Hemoptysis   . Hyperlipidemia   . Hypertension   . Nodule of skin of breast   . Obese   . PSA elevation     Past Surgical History:  Procedure Laterality Date  . NO PAST SURGERIES       Medications: Current Meds  Medication Sig  . amLODipine (NORVASC) 5 MG tablet TAKE 1 TABLET BY MOUTH EVERYDAY AT BEDTIME  . apixaban (ELIQUIS) 5 MG TABS tablet Take 1 tablet (5 mg total) by mouth 2 (two) times daily.  Marland Kitchen aspirin 81 MG EC tablet Take 81 mg by mouth daily. Swallow whole.  . diazepam (VALIUM) 10 MG tablet Take 5 mg by mouth as needed for anxiety.   Marland Kitchen EPINEPHrine 0.3 mg/0.3 mL IJ SOAJ injection Inject 1 mg into the muscle as directed.  Marland Kitchen FLUTICASONE PROPIONATE NA Place 50 mcg into the nose as needed (seasonal allergies).   . loratadine (CLARITIN) 10 MG tablet Take 20 mg by mouth daily.  . metoprolol succinate (TOPROL-XL) 50 MG 24 hr tablet daily.   Marland Kitchen  tamsulosin (FLOMAX) 0.4 MG CAPS capsule Take 0.4 mg by mouth daily after supper.      Allergies: Allergies  Allergen Reactions  . Alpha-Gal Hives    Social History: The patient  reports that he has been smoking cigars. He has been smoking about 0.50 packs per day. He has never used smokeless tobacco. He reports current alcohol use.   Family History: The patient's family history includes Asthma in his brother and father; CVA in an other family member; Diabetes in his mother; Heart attack in an other family member; Heart disease in his father and another family member; Hypertension in his mother and another family member.   Review of Systems: Please see the history of present illness.   All other systems are reviewed and negative.   Physical Exam: VS:  BP  138/82   Pulse 92   Ht 6' (1.829 m)   Wt 268 lb 6.4 oz (121.7 kg)   SpO2 98%   BMI 36.40 kg/m  .  BMI Body mass index is 36.4 kg/m.  Wt Readings from Last 3 Encounters:  10/13/19 268 lb 6.4 oz (121.7 kg)  09/15/19 271 lb 12.8 oz (123.3 kg)  06/09/19 268 lb (121.6 kg)    General: Pleasant. Well developed, well nourished and in no acute distress.   HEENT: Normal.  Neck: Supple, no JVD, carotid bruits, or masses noted.  Cardiac: Irregular irregular rhythm. No murmurs, rubs, or gallops. No edema.  Respiratory:  Lungs are clear to auscultation bilaterally with normal work of breathing.  GI: Soft and nontender.  MS: No deformity or atrophy. Gait and ROM intact.  Skin: Warm and dry. Color is normal.  Neuro:  Strength and sensation are intact and no gross focal deficits noted.  Psych: Alert, appropriate and with normal affect.   LABORATORY DATA:  EKG:  EKG is ordered today.  Personally reviewed by me. This demonstrates AF with controlled VR - 92.  Lab Results  Component Value Date   WBC 9.8 09/15/2019   HGB 17.3 09/15/2019   HCT 49.1 09/15/2019   PLT 252 09/15/2019   GLUCOSE 90 09/15/2019   NA 137 09/15/2019   K 3.8 09/15/2019   CL 99 09/15/2019   CREATININE 1.03 09/15/2019   BUN 12 09/15/2019   CO2 25 09/15/2019   TSH 1.850 09/15/2019       BNP (last 3 results) No results for input(s): BNP in the last 8760 hours.  ProBNP (last 3 results) No results for input(s): PROBNP in the last 8760 hours.   Other Studies Reviewed Today:  ECHO IMPRESSIONS 08/2019  1. Left ventricular ejection fraction, by estimation, is 50 to 55%. The  left ventricle has low normal function. The left ventricle has no regional  wall motion abnormalities. Left ventricular diastolic function could not  be evaluated.  2. Right ventricular systolic function is normal. The right ventricular  size is normal. The estimated right ventricular systolic pressure is 99991111  mmHg.  3. Left atrial  size was mildly dilated.  4. The mitral valve is normal in structure. Trivial mitral valve  regurgitation. No evidence of mitral stenosis.  5. The aortic valve is normal in structure. Aortic valve regurgitation is  not visualized. Mild aortic valve sclerosis is present, with no evidence  of aortic valve stenosis.  6. The inferior vena cava is normal in size with greater than 50%  respiratory variability, suggesting right atrial pressure of 3 mmHg.    CHEST CT IMPRESSION 08/2019: 1. Unchanged 4  mm nodule in the left upper lobe, benign. 2. 4 mm nodule in the right upper lobe, not included in the field of view on the prior study. No follow-up needed if patient is low-risk. Non-contrast chest CT can be considered in 12 months if patient is high-risk. This recommendation follows the consensus statement: Guidelines for Management of Incidental Pulmonary Nodules Detected on CT Images: From the Fleischner Society 2017; Radiology 2017; 284:228-243. 3. Emphysema (ICD10-J43.9). 4. Aortic atherosclerosis (ICD10-I70.0).   Electronically Signed By: Titus Dubin M.D. On: 09/07/2019 09:18   CORONARY CT FINDINGS 08/2018: Coronary calcium score: The patient's coronary artery calcium score is 1, which places the patient in the 35 percentile.  Coronary arteries: Normal coronary origins. Right dominance.  Moderately tortuous coronary arteries.  Right Coronary Artery: No detectable plaque or stenosis.  Left Main Coronary Artery: No detectable plaque or stenosis.  Left Anterior Descending Coronary Artery: Mild mixed atherosclerotic plaque in the mid LAD (25-49% stenosis), otherwise no significant stenosis.  Left Circumflex Artery: No detectable plaque or stenosis.  Aorta: Normal size, 36 mm at mid ascending aorta (level of PA bifurcation, double oblique measurement). No calcifications. No dissection.  Aortic Valve: No calcifications.  Other findings:  Normal  pulmonary vein drainage into the left atrium.  Normal left atrial appendage without a thrombus.  Borderline enlargement of main pulmonary artery, approximately 30 mm.  IMPRESSION: 1. Mild CAD of mid LAD, CADRADS = 2. Moderately tortuous coronary arteries.  2. The patient's coronary artery calcium score is 1, which places the patient in the 43 percentile for age and sex matched control.  3. Normal coronary origin with right dominance.   Electronically Signed By: Cherlynn Kaiser On: 09/05/2018 12:35   FINDINGS: Limited view of the lung parenchyma demonstrates 4 mm nodule in the LEFT upper lobe (image 7/12). Airways are normal.  Limited view of the mediastinum demonstrates no adenopathy. Esophagus normal.  Limited view of the upper abdomen unremarkable.  Limited view of the skeleton and chest wall is unremarkable.  IMPRESSION: LEFT upper lobe noncalcified pulmonary nodule. No follow-up needed if patient is low-risk. Non-contrast chest CT can be considered in 12 months if patient is high-risk. This recommendation follows the consensus statement: Guidelines for Management of Incidental Pulmonary Nodules Detected on CT Images: From the Fleischner Society 2017; Radiology 2017; 284:228-243.  These results will be called to the ordering clinician or representative by the Radiologist Assistant, and communication documented in the PACS or zVision Dashboard.  Electronically Signed: By: Suzy Bouchard M.D. On: 09/05/2018 09:10   Assessment/Plan:  1. New onset AF - VR is fairly controlled - he is on Eliquis - no missed doses - echo updated - some LAE noted - this may make restoration a little more difficult - would consider EP referral to Dr. Rayann Heman for consideration of ablation/AAD therapy if fails to convert. Cardioversion arranged for May 7h. Labs and COVID testing May 5th. Procedure has been reviewed and he is willing to proceed.   2. HTN - BP is  ok.   3. CAD - prior coronary CT with mild disease noted - managed medically - no symptoms.   4. Possible OSA - will arrange for sleep study  5. Lung nodule - needs repeat scan in one year - not discussed today.   6. Occasional cigar smoking - total cessation encouraged.   7. HLD   8. Obesity   9. Prostate cancer - followed by urology. Recent check was ok - for repeat in 3 months.  10. COVID-19 Education: The signs and symptoms of COVID-19 were discussed with the patient and how to seek care for testing (follow up with PCP or arrange E-visit).  The importance of social distancing, staying at home, hand hygiene and wearing a mask when out in public were discussed today. He has had one vaccine.   Current medicines are reviewed with the patient today.  The patient does not have concerns regarding medicines other than what has been noted above.  The following changes have been made:  See above.  Labs/ tests ordered today include:    Orders Placed This Encounter  Procedures  . Basic metabolic panel  . CBC  . PT and PTT  . EKG 12-Lead     Disposition:   FU with me 2 weeks post cardioversion.    Patient is agreeable to this plan and will call if any problems develop in the interim.   SignedTruitt Merle, NP  10/13/2019 10:00 AM  [facility] [address] [place], [place]  [address] Phone: [phone] Fax: [phone]

## 2019-10-13 ENCOUNTER — Other Ambulatory Visit (HOSPITAL_COMMUNITY): Payer: BC Managed Care – PPO

## 2019-10-13 ENCOUNTER — Encounter: Payer: Self-pay | Admitting: Nurse Practitioner

## 2019-10-13 ENCOUNTER — Other Ambulatory Visit: Payer: Self-pay

## 2019-10-13 ENCOUNTER — Ambulatory Visit: Payer: BC Managed Care – PPO | Admitting: Nurse Practitioner

## 2019-10-13 VITALS — BP 138/82 | HR 92 | Ht 72.0 in | Wt 268.4 lb

## 2019-10-13 DIAGNOSIS — R0602 Shortness of breath: Secondary | ICD-10-CM

## 2019-10-13 DIAGNOSIS — I4891 Unspecified atrial fibrillation: Secondary | ICD-10-CM | POA: Diagnosis not present

## 2019-10-13 DIAGNOSIS — I10 Essential (primary) hypertension: Secondary | ICD-10-CM

## 2019-10-13 DIAGNOSIS — Z7189 Other specified counseling: Secondary | ICD-10-CM

## 2019-10-13 DIAGNOSIS — E7849 Other hyperlipidemia: Secondary | ICD-10-CM

## 2019-10-13 DIAGNOSIS — R0683 Snoring: Secondary | ICD-10-CM | POA: Diagnosis not present

## 2019-10-13 NOTE — Patient Instructions (Addendum)
After Visit Summary:  We will be checking the following labs today - NONE  Come here for lab on Wednesday May 5th for BMET, CBC, PT and PTT   Medication Instructions:    Continue with your current medicines.    If you need a refill on your cardiac medications before your next appointment, please call your pharmacy.     Testing/Procedures To Be Arranged:  Cardioversion  Sleep Study  Follow-Up:  See me in about 2 weeks post cardioversion with EKG   At [facility], you and your health needs are our priority.  As part of our continuing mission to provide you with exceptional heart care, we have created designated Provider Care Teams.  These Care Teams include your primary Cardiologist (physician) and Advanced Practice Providers (APPs -  Physician Assistants and Nurse Practitioners) who all work together to provide you with the care you need, when you need it.  Special Instructions:  . Stay safe, stay home, wash your hands for at least 20 seconds and wear a mask when out in public.  . It was good to talk with you today.   Your provider has recommended a cardioversion.   COVID SCREENING INFORMATION: You are scheduled for your COVID screening on Wednesday May 5th at ______________________________________. [facility] (old [facility]) [address] Stay in the RIGHT lane and proceed under the brick awning (NOT the tent) and tell them you are there for pre-procedure testing Do NOT bring any pets with you to the testing site   You are scheduled for a cardioversion on Friday, May 7th at [place] with Dr. Harrington Challenger or associates. Please go to [facility] ([address] 2nd [place] Stay at Friday, May 7th at 6:30AM.  There is free valet parking available.  Enter through the [place] not have any food or drink after midnight on Thursday, May 6th.  You may take your medicines with a sip of water on the day of your procedure.      DO NOT STOP YOUR ANTICOAGULANT (BLOOD THINNER) ELIQUIS   You will need someone to drive you home following your procedure and stay in the waiting room during your procedure. Failure to do so could result in your procedure being cancelled.   Every effort is made to have your procedure done on time. Occasionally there are emergencies that occur at the hospital that may cause delays.   Call the [place] office at [phone] if you have any questions, problems or concerns.     Electrical Cardioversion Electrical cardioversion is the delivery of a jolt of electricity to change the rhythm of the heart. Sticky patches or metal paddles are placed on the chest to deliver the electricity from a device. This is done to restore a normal rhythm. A rhythm that is too fast or not regular keeps the heart from pumping well. Electrical cardioversion is done in an emergency if:   There is low or no blood pressure as a result of the heart rhythm.    Normal rhythm must be restored as fast as possible to protect the brain and heart from further damage.    It may save a life. Cardioversion may be done for heart rhythms that are not immediately life threatening, such as atrial fibrillation or flutter, in which:   The heart is beating too fast or is not regular.    Medicine to change the rhythm has not worked.  It is safe to wait in order to allow time for preparation.  Symptoms of the abnormal rhythm are bothersome.  The risk of stroke and other serious problems can be reduced.  LET [facility] CARE PROVIDER KNOW ABOUT:   Any allergies you have.  All medicines you are taking, including vitamins, herbs, eye drops, creams, and over-the-counter medicines.  Previous problems you or members of your family have had with the use of anesthetics.    Any blood disorders you have.    Previous surgeries you have had.    Medical conditions you have.   RISKS AND  COMPLICATIONS  Generally, this is a safe procedure. However, problems can occur and include:   Breathing problems related to the anesthetic used.  A blood clot that breaks free and travels to other parts of your body. This could cause a stroke or other problems. The risk of this is lowered by use of blood-thinning medicine (anticoagulant) prior to the procedure.  Cardiac arrest (rare).   BEFORE THE PROCEDURE   You may have tests to detect blood clots in your heart and to evaluate heart function.   You may start taking anticoagulants so your blood does not clot as easily.    Medicines may be given to help stabilize your heart rate and rhythm.   PROCEDURE  You will be given medicine through an IV tube to reduce discomfort and make you sleepy (sedative).    An electrical shock will be delivered.   AFTER THE PROCEDURE Your heart rhythm will be watched to make sure it does not change. You will need someone to drive you home.      Call the [place] office at [phone] if you have any questions, problems or concerns.

## 2019-10-14 ENCOUNTER — Other Ambulatory Visit: Payer: Self-pay | Admitting: *Deleted

## 2019-10-14 DIAGNOSIS — R0683 Snoring: Secondary | ICD-10-CM

## 2019-10-14 DIAGNOSIS — I4891 Unspecified atrial fibrillation: Secondary | ICD-10-CM

## 2019-10-26 ENCOUNTER — Telehealth: Payer: Self-pay

## 2019-10-26 NOTE — Telephone Encounter (Signed)
Clinical information as requested submitted via portal as directed by [facility] [phone]) for inlab sleep study

## 2019-10-27 NOTE — Telephone Encounter (Signed)
Per [employer] Ref# [id number] no precert is required. Please schedule sleep study. Forwarded to Gershon Cull for scheduling.

## 2019-10-28 ENCOUNTER — Other Ambulatory Visit: Payer: Self-pay

## 2019-10-28 ENCOUNTER — Other Ambulatory Visit: Payer: BC Managed Care – PPO | Admitting: *Deleted

## 2019-10-28 ENCOUNTER — Other Ambulatory Visit (HOSPITAL_COMMUNITY)
Admission: RE | Admit: 2019-10-28 | Discharge: 2019-10-28 | Disposition: A | Discharge status: Home / Self Care | Payer: BC Managed Care – PPO | Source: Ambulatory Visit | Attending: Internal Medicine | Admitting: Internal Medicine

## 2019-10-28 DIAGNOSIS — Z01812 Encounter for preprocedural laboratory examination: Secondary | ICD-10-CM | POA: Diagnosis not present

## 2019-10-28 DIAGNOSIS — I4891 Unspecified atrial fibrillation: Secondary | ICD-10-CM

## 2019-10-28 DIAGNOSIS — Z20822 Contact with and (suspected) exposure to covid-19: Secondary | ICD-10-CM | POA: Insufficient documentation

## 2019-10-28 LAB — BASIC METABOLIC PANEL
BUN/Creatinine Ratio: 8 — ABNORMAL LOW (ref 9–20)
BUN: 9 mg/dL (ref 6–24)
CO2: 25 mmol/L (ref 20–29)
Calcium: 9.3 mg/dL (ref 8.7–10.2)
Chloride: 103 mmol/L (ref 96–106)
Creatinine, Ser: 1.08 mg/dL (ref 0.76–1.27)
GFR calc Af Amer: 86 mL/min/{1.73_m2} (ref >59)
GFR calc non Af Amer: 75 mL/min/{1.73_m2} (ref >59)
Glucose: 95 mg/dL (ref 65–99)
Potassium: 4 mmol/L (ref 3.5–5.2)
Sodium: 138 mmol/L (ref 134–144)

## 2019-10-28 LAB — PT AND PTT
INR: 1 (ref 0.9–1.2)
Prothrombin Time: 11 s (ref 9.1–12.0)
aPTT: 27 s (ref 24–33)

## 2019-10-28 LAB — CBC
Hematocrit: 47.2 % (ref 37.5–51.0)
Hemoglobin: 16.2 g/dL (ref 13.0–17.7)
MCH: 29.9 pg (ref 26.6–33.0)
MCHC: 34.3 g/dL (ref 31.5–35.7)
MCV: 87 fL (ref 79–97)
Platelets: 241 10*3/uL (ref 150–450)
RBC: 5.42 x10E6/uL (ref 4.14–5.80)
RDW: 14.2 % (ref 11.6–15.4)
WBC: 7.5 10*3/uL (ref 3.4–10.8)

## 2019-10-28 LAB — SARS CORONAVIRUS 2 (TAT 6-24 HRS): SARS Coronavirus 2: NEGATIVE

## 2019-10-29 ENCOUNTER — Telehealth: Payer: Self-pay | Admitting: *Deleted

## 2019-10-29 NOTE — Telephone Encounter (Signed)
S/w pt is aware of lab results.  Closed lab in error.

## 2019-10-30 ENCOUNTER — Ambulatory Visit (HOSPITAL_COMMUNITY): Payer: BC Managed Care – PPO | Admitting: Certified Registered"

## 2019-10-30 ENCOUNTER — Encounter (HOSPITAL_COMMUNITY)
Admission: RE | Disposition: A | Discharge status: Home / Self Care | Payer: BC Managed Care – PPO | Source: Home / Self Care | Attending: Internal Medicine

## 2019-10-30 ENCOUNTER — Ambulatory Visit (HOSPITAL_COMMUNITY)
Admission: RE | Admit: 2019-10-30 | Discharge: 2019-10-30 | Disposition: A | Discharge status: Home / Self Care | Payer: BC Managed Care – PPO | Attending: Internal Medicine | Admitting: Internal Medicine

## 2019-10-30 ENCOUNTER — Other Ambulatory Visit: Payer: Self-pay

## 2019-10-30 ENCOUNTER — Encounter (HOSPITAL_COMMUNITY): Payer: Self-pay | Admitting: Internal Medicine

## 2019-10-30 DIAGNOSIS — Z8546 Personal history of malignant neoplasm of prostate: Secondary | ICD-10-CM | POA: Insufficient documentation

## 2019-10-30 DIAGNOSIS — Z7901 Long term (current) use of anticoagulants: Secondary | ICD-10-CM | POA: Insufficient documentation

## 2019-10-30 DIAGNOSIS — F419 Anxiety disorder, unspecified: Secondary | ICD-10-CM | POA: Insufficient documentation

## 2019-10-30 DIAGNOSIS — I4891 Unspecified atrial fibrillation: Secondary | ICD-10-CM

## 2019-10-30 DIAGNOSIS — E669 Obesity, unspecified: Secondary | ICD-10-CM | POA: Diagnosis not present

## 2019-10-30 DIAGNOSIS — F17290 Nicotine dependence, other tobacco product, uncomplicated: Secondary | ICD-10-CM | POA: Insufficient documentation

## 2019-10-30 DIAGNOSIS — E785 Hyperlipidemia, unspecified: Secondary | ICD-10-CM | POA: Diagnosis not present

## 2019-10-30 DIAGNOSIS — J301 Allergic rhinitis due to pollen: Secondary | ICD-10-CM | POA: Diagnosis not present

## 2019-10-30 DIAGNOSIS — Z79899 Other long term (current) drug therapy: Secondary | ICD-10-CM | POA: Diagnosis not present

## 2019-10-30 DIAGNOSIS — Z6834 Body mass index (BMI) 34.0-34.9, adult: Secondary | ICD-10-CM | POA: Insufficient documentation

## 2019-10-30 DIAGNOSIS — I2510 Atherosclerotic heart disease of native coronary artery without angina pectoris: Secondary | ICD-10-CM | POA: Insufficient documentation

## 2019-10-30 DIAGNOSIS — I10 Essential (primary) hypertension: Secondary | ICD-10-CM | POA: Diagnosis not present

## 2019-10-30 HISTORY — PX: CARDIOVERSION: SHX1299

## 2019-10-30 SURGERY — CARDIOVERSION
Anesthesia: General

## 2019-10-30 MED ORDER — PROPOFOL 10 MG/ML IV BOLUS
INTRAVENOUS | Status: DC | PRN
  Administered 2019-10-30: 70 mg via INTRAVENOUS
  Administered 2019-10-30: 30 mg via INTRAVENOUS
  Administered 2019-10-30: 40 mg via INTRAVENOUS
  Administered 2019-10-30: 30 mg via INTRAVENOUS
  Administered 2019-10-30: 40 mg via INTRAVENOUS

## 2019-10-30 MED ORDER — LIDOCAINE HCL (CARDIAC) PF 100 MG/5ML IV SOSY
PREFILLED_SYRINGE | INTRAVENOUS | Status: DC | PRN
  Administered 2019-10-30: 40 mg via INTRAVENOUS

## 2019-10-30 MED ORDER — SODIUM CHLORIDE 0.9 % IV SOLN: INTRAVENOUS | Status: DC

## 2019-10-30 NOTE — Anesthesia Postprocedure Evaluation (Signed)
Anesthesia Post Note  Patient: James Weeks  Procedure(s) Performed: CARDIOVERSION (N/A )     Patient location during evaluation: PACU Anesthesia Type: General Level of consciousness: awake and alert Pain management: pain level controlled Vital Signs Assessment: post-procedure vital signs reviewed and stable Respiratory status: spontaneous breathing, nonlabored ventilation, respiratory function stable and patient connected to nasal cannula oxygen Cardiovascular status: blood pressure returned to baseline and stable Postop Assessment: no apparent nausea or vomiting Anesthetic complications: no    Last Vitals:  Vitals:   10/30/19 0820 10/30/19 0830  BP: 120/90 (!) 123/91  Pulse: 73 66  Resp: 17 18  Temp:    SpO2: 94% 99%    Last Pain:  Vitals:   10/30/19 0830  TempSrc:   PainSc: 0-No pain                 Effie Berkshire

## 2019-10-30 NOTE — Interval H&P Note (Signed)
History and Physical Interval Note:  10/30/2019 7:02 AM  James Weeks  has presented today for surgery, with the diagnosis of AFIB.  The various methods of treatment have been discussed with the patient and family. After consideration of risks, benefits and other options for treatment, the patient has consented to  Procedure(s): CARDIOVERSION (N/A) as a surgical intervention.  The patient's history has been reviewed, patient examined, no change in status, stable for surgery.  I have reviewed the patient's chart and labs.  Questions were answered to the patient's satisfaction.     [name]

## 2019-10-30 NOTE — Interval H&P Note (Signed)
History and Physical Interval Note:  10/30/2019 7:01 AM  James Weeks  has presented today for surgery, with the diagnosis of AFIB.  The various methods of treatment have been discussed with the patient and family. After consideration of risks, benefits and other options for treatment, the patient has consented to  Procedure(s): CARDIOVERSION (N/A) as a surgical intervention.  The patient's history has been reviewed, patient examined, no change in status, stable for surgery.  I have reviewed the patient's chart and labs.  Questions were answered to the patient's satisfaction.     [name]

## 2019-10-30 NOTE — Transfer of Care (Signed)
Immediate Anesthesia Transfer of Care Note  Patient: James Weeks  Procedure(s) Performed: CARDIOVERSION (N/A )  Patient Location: Endoscopy Unit  Anesthesia Type:General  Level of Consciousness: awake, alert , oriented and drowsy  Airway & Oxygen Therapy: Patient Spontanous Breathing  Post-op Assessment: Report given to RN, Post -op Vital signs reviewed and stable and Patient moving all extremities X 4  Post vital signs: Reviewed and stable  Last Vitals:  Vitals Value Taken Time  BP 115/81 10/30/19 0813  Temp    Pulse 70 10/30/19 0814  Resp 12 10/30/19 0814  SpO2 98 % 10/30/19 0814  Vitals shown include unvalidated device data.  Last Pain:  Vitals:   10/30/19 0813  TempSrc:   PainSc: 0-No pain         Complications: No apparent anesthesia complications

## 2019-10-30 NOTE — CV Procedure (Signed)
CARDIOVERSION  Patient anesthetized by anesthesia with Propofol and lidocaine intravenously  With pads in AP position, cardioversion attempted with 200 J synchronized biphasic energy   Unsuccessful. Again, with pads in AP position cardioversion attempted with 200 J synchronized biphasic energy but with pressure applied to chest during expiration.  Unsuccessful  A new set of pads applied in Apex/Base position.  With 200 J synchronized biphasic energy, cardioversion to SR was successful.  Procedures were without complication  12 lead EKG pending   [name] MD

## 2019-10-30 NOTE — Anesthesia Preprocedure Evaluation (Signed)
Anesthesia Evaluation  Patient identified by MRN, date of birth, ID band Patient awake    Reviewed: Allergy & Precautions, NPO status , Patient's Chart, lab work & pertinent test results  Airway Mallampati: I  TM Distance: >3 FB Neck ROM: Full    Dental  (+) Teeth Intact, Dental Advisory Given   Pulmonary Current Smoker,    breath sounds clear to auscultation       Cardiovascular hypertension,  Rhythm:Irregular Rate:Normal     Neuro/Psych Anxiety negative neurological ROS     GI/Hepatic negative GI ROS, Neg liver ROS,   Endo/Other  negative endocrine ROS  Renal/GU negative Renal ROS     Musculoskeletal negative musculoskeletal ROS (+)   Abdominal (+) + obese,   Peds  Hematology negative hematology ROS (+)   Anesthesia Other Findings   Reproductive/Obstetrics                             Anesthesia Physical Anesthesia Plan  ASA: III  Anesthesia Plan: General   Post-op Pain Management:    Induction: Intravenous  PONV Risk Score and Plan: 0  Airway Management Planned: Natural Airway and Simple Face Mask  Additional Equipment: None  Intra-op Plan:   Post-operative Plan:   Informed Consent: I have reviewed the patients History and Physical, chart, labs and discussed the procedure including the risks, benefits and alternatives for the proposed anesthesia with the patient or authorized representative who has indicated his/her understanding and acceptance.       Plan Discussed with: CRNA  Anesthesia Plan Comments: (Echo:  1. Left ventricular ejection fraction, by estimation, is 50 to 55%. The  left ventricle has low normal function. The left ventricle has no regional  wall motion abnormalities. Left ventricular diastolic function could not  be evaluated.  2. Right ventricular systolic function is normal. The right ventricular  size is normal. The estimated right ventricular  systolic pressure is 99991111  mmHg.  3. Left atrial size was mildly dilated.  4. The mitral valve is normal in structure. Trivial mitral valve  regurgitation. No evidence of mitral stenosis.  5. The aortic valve is normal in structure. Aortic valve regurgitation is  not visualized. Mild aortic valve sclerosis is present, with no evidence  of aortic valve stenosis.  6. The inferior vena cava is normal in size with greater than 50%  respiratory variability, suggesting right atrial pressure of 3 mmHg. )        Anesthesia Quick Evaluation

## 2019-10-30 NOTE — Discharge Instructions (Signed)
Electrical Cardioversion   What can I expect after the procedure?  Your blood pressure, heart rate, breathing rate, and blood oxygen level will be monitored until you leave the hospital or clinic.  Your heart rhythm will be watched to make sure it does not change.  You may have some redness on the skin where the shocks were given. Follow these instructions at home:  Do not drive for 24 hours if you were given a sedative during your procedure.  Take over-the-counter and prescription medicines only as told by your health care provider.  Ask your health care provider how to check your pulse. Check it often.  Rest for 48 hours after the procedure or as told by your health care provider.  Avoid or limit your caffeine use as told by your health care provider.  Keep all follow-up visits as told by your health care provider. This is important. Contact a health care provider if:  You feel like your heart is beating too quickly or your pulse is not regular.  You have a serious muscle cramp that does not go away. Get help right away if:  You have discomfort in your chest.  You are dizzy or you feel faint.  You have trouble breathing or you are short of breath.  Your speech is slurred.  You have trouble moving an arm or leg on one side of your body.  Your fingers or toes turn cold or blue. Summary  Electrical cardioversion is the delivery of a jolt of electricity to restore a normal rhythm to the heart.  This procedure may be done right away in an emergency or may be a scheduled procedure if the condition is not an emergency.  Generally, this is a safe procedure.  After the procedure, check your pulse often as told by your health care provider. This information is not intended to replace advice given to you by your health care provider. Make sure you discuss any questions you have with your health care provider. Document Revised: 01/12/2019 Document Reviewed:  01/12/2019 Elsevier Patient Education  2020 Elsevier Inc.  

## 2019-10-31 DIAGNOSIS — Z23 Encounter for immunization: Secondary | ICD-10-CM | POA: Diagnosis not present

## 2019-11-03 ENCOUNTER — Telehealth: Payer: Self-pay | Admitting: *Deleted

## 2019-11-03 NOTE — Telephone Encounter (Signed)
-----   Message from Chapman Moss, RN sent at 10/27/2019  2:02 PM EDT ----- Regarding: FW: precert Per [employer] Ref# [id number] no precert is required. Please schedule sleep study. ----- Message ----- From: [name], CMA Sent: 10/13/2019  12:34 PM EDT To: [facility] Subject: precert                                        Split night ----- Message ----- From: Richmond Campbell, LPN Sent: [id number]  10:18 AM EDT To: [name], CMA  PT NEEDS SLEEP STUDY PER LORI GERHARDT DX SNORING AND AFIB  THANKS CHRISTINE

## 2019-11-03 NOTE — Telephone Encounter (Signed)
Patient is scheduled for lab study on 11/17/19. pt is scheduled for COVID screening on 11/14/19 10:50  prior to ss.  Patient understands he sleep study will be done at [facility] sleep lab. Patient understands he will receive a sleep packet in a week or so. Patient understands to call if he does not receive the sleep packet in a timely manner.  Left detailed message on voicemail with date and time of titration and informed patient to call back to confirm or reschedule.

## 2019-11-11 NOTE — Progress Notes (Signed)
CARDIOLOGY OFFICE NOTE  Date:  11/16/2019    James Weeks Date of Birth: [birth year] Medical Record [id number]  PCP:  [name], MD (Inactive)  Cardiologist:  Servando Snare     Chief Complaint  Patient presents with  . Follow-up    History of Present Illness: James Weeks is a 59 y.o. male who presents today for a post cardioversion visit. He followswith me.I am an acquaintance of his wife's.  He has a history ofHTN, HLD, elevated PSA, elevated blood sugar, obesity and anxiety. Looks to have had past intolerance to HCTZ due to gynecomastia.FH is quite + for CAD. He is a smoker- has stopped cigarettes and now smokes cigars.  I saw himlast year- has had long standing HTN. Had a spell of presyncope about10 yearsprior- negative stress test and echo at that time - BP very high and he was started on antihypertensive regimen. He has had his HCTZ stopped due to gynecomastia. He self referred himself here for DOE and chest tightness. BP still not controlled. Norvasc was added. Coronary CT obtained. To consider sleep study but he did not really seem to have any issues. Had stopped exercising. Has had elevated PSA - placed on antibiotics andwas to be havingprostate biopsy.  Last back in March for his routine visit - was doing ok - ARB stopped due to alpha gal allergy. Toprol had been added to his regimen. BP fair. He was noted to be in AF at that visit - he was totally asymptomatic - Eliquis was started - echo arranged and plan for cardioversion after a month of anticoagulation. This was done earlier this month with restoration back to NSR.   The patient does not have symptoms concerning for COVID-19 infection (fever, chills, cough, or new shortness of breath).   Comes in today. Here alone. He is doing well. He notes that his energy level has improved - he really did not realize how fatigued he was prior to cardioversion. No chest pain. Breathing is good. He has  rescheduled his sleep study - he was wanting to get a break from all the appointments. No problems with his Eliquis. He feels like he is doing well.   Past Medical History:  Diagnosis Date  . Allergic rhinitis due to pollen   . Angina of effort ([place])   . Anxiety   . Body mass index 34.0-34.9, adult   . Elevated blood sugar   . Hemoptysis   . Hyperlipidemia   . Hypertension   . Nodule of skin of breast   . Obese   . PSA elevation     Past Surgical History:  Procedure Laterality Date  . CARDIOVERSION N/A 10/30/2019   Procedure: CARDIOVERSION;  Surgeon: [name], MD;  Location: [facility] ENDOSCOPY;  Service: Cardiovascular;  Laterality: N/A;  . NO PAST SURGERIES       Medications: Current Meds  Medication Sig  . amLODipine (NORVASC) 5 MG tablet TAKE 1 TABLET BY MOUTH EVERYDAY AT BEDTIME  . apixaban (ELIQUIS) 5 MG TABS tablet Take 1 tablet (5 mg total) by mouth 2 (two) times daily.  Marland Kitchen aspirin 81 MG EC tablet Take 81 mg by mouth daily. Swallow whole.  . diazepam (VALIUM) 10 MG tablet Take 5 mg by mouth daily as needed for anxiety.   Marland Kitchen EPINEPHrine 0.3 mg/0.3 mL IJ SOAJ injection Inject 1 mg into the muscle as needed for anaphylaxis.   . fluticasone (FLONASE) 50 MCG/ACT nasal spray Place 50 mcg into  the nose daily.   Marland Kitchen loratadine (CLARITIN) 10 MG tablet Take 20 mg by mouth daily.  . metoprolol succinate (TOPROL-XL) 100 MG 24 hr tablet Take 100 mg by mouth at bedtime.   . tamsulosin (FLOMAX) 0.4 MG CAPS capsule Take 0.4 mg by mouth at bedtime.      Allergies: Allergies  Allergen Reactions  . Alpha-Gal Hives    Red meat    Social History: The patient  reports that he has been smoking cigars. He has been smoking about 0.50 packs per day. He has never used smokeless tobacco. He reports current alcohol use.   Family History: The patient's family history includes Asthma in his brother and father; CVA in an other family member; Diabetes in his mother; Heart attack in an other family  member; Heart disease in his father and another family member; Hypertension in his mother and another family member.   Review of Systems: Please see the history of present illness.   All other systems are reviewed and negative.   Physical Exam: VS:  BP 128/88   Pulse 73   Ht 6\' 1"  (1.854 m)   Wt 268 lb 12.8 oz (121.9 kg)   SpO2 97%   BMI 35.46 kg/m  .  BMI Body mass index is 35.46 kg/m.  Wt Readings from Last 3 Encounters:  11/16/19 268 lb 12.8 oz (121.9 kg)  10/30/19 270 lb (122.5 kg)  10/13/19 268 lb 6.4 oz (121.7 kg)    General: Pleasant. Alert and in no acute distress.   Cardiac: Regular rate and rhythm. Rare ectopic. No murmurs, rubs, or gallops. No edema.  Respiratory:  Lungs are clear to auscultation bilaterally with normal work of breathing.  GI: Soft and nontender.  MS: No deformity or atrophy. Gait and ROM intact.  Skin: Warm and dry. Color is normal.  Neuro:  Strength and sensation are intact and no gross focal deficits noted.  Psych: Alert, appropriate and with normal affect.   LABORATORY DATA:  EKG:  EKG is ordered today.  Personally reviewed by me. This demonstrates restoration back to NSR - HR is 69.  Lab Results  Component Value Date   WBC 7.5 10/28/2019   HGB 16.2 10/28/2019   HCT 47.2 10/28/2019   PLT 241 10/28/2019   GLUCOSE 95 10/28/2019   NA 138 10/28/2019   K 4.0 10/28/2019   CL 103 10/28/2019   CREATININE 1.08 10/28/2019   BUN 9 10/28/2019   CO2 25 10/28/2019   TSH 1.850 09/15/2019   INR 1.0 10/28/2019       BNP (last 3 results) No results for input(s): BNP in the last 8760 hours.  ProBNP (last 3 results) No results for input(s): PROBNP in the last 8760 hours.   Other Studies Reviewed Today:  CARDIOVERSION 10/30/2019  Patient anesthetized by anesthesia with Propofol and lidocaine intravenously  With pads in AP position, cardioversion attempted with 200 J synchronized biphasic energy   Unsuccessful. Again, with pads in AP  position cardioversion attempted with 200 J synchronized biphasic energy but with pressure applied to chest during expiration.  Unsuccessful  A new set of pads applied in Apex/Base position.  With 200 J synchronized biphasic energy, cardioversion to SR was successful.  Procedures were without complication  12 lead EKG pending   [name] MD    ECHO IMPRESSIONS 08/2019  1. Left ventricular ejection fraction, by estimation, is 50 to 55%. The  left ventricle has low normal function. The left ventricle has no regional  wall motion abnormalities. Left ventricular diastolic function could not  be evaluated.  2. Right ventricular systolic function is normal. The right ventricular  size is normal. The estimated right ventricular systolic pressure is 99991111  mmHg.  3. Left atrial size was mildly dilated.  4. The mitral valve is normal in structure. Trivial mitral valve  regurgitation. No evidence of mitral stenosis.  5. The aortic valve is normal in structure. Aortic valve regurgitation is  not visualized. Mild aortic valve sclerosis is present, with no evidence  of aortic valve stenosis.  6. The inferior vena cava is normal in size with greater than 50%  respiratory variability, suggesting right atrial pressure of 3 mmHg.    CHEST CTIMPRESSION3/2021: 1. Unchanged 4 mm nodule in the left upper lobe, benign. 2. 4 mm nodule in the right upper lobe, not included in the field of view on the prior study. No follow-up needed if patient is low-risk. Non-contrast chest CT can be considered in 12 months if patient is high-risk. This recommendation follows the consensus statement: Guidelines for Management of Incidental Pulmonary Nodules Detected on CT Images: From the Fleischner Society 2017; Radiology 2017; 284:228-243. 3. Emphysema (ICD10-J43.9). 4. Aortic atherosclerosis (ICD10-I70.0).   Electronically Signed By: Titus Dubin M.D. On: 09/07/2019  09:18   CORONARY CTFINDINGS3/2020: Coronary calcium score: The patient's coronary artery calcium score is 1, which places the patient in the 35 percentile.  Coronary arteries: Normal coronary origins. Right dominance.  Moderately tortuous coronary arteries.  Right Coronary Artery: No detectable plaque or stenosis.  Left Main Coronary Artery: No detectable plaque or stenosis.  Left Anterior Descending Coronary Artery: Mild mixed atherosclerotic plaque in the mid LAD (25-49% stenosis), otherwise no significant stenosis.  Left Circumflex Artery: No detectable plaque or stenosis.  Aorta: Normal size, 36 mm at mid ascending aorta (level of PA bifurcation, double oblique measurement). No calcifications. No dissection.  Aortic Valve: No calcifications.  Other findings:  Normal pulmonary vein drainage into the left atrium.  Normal left atrial appendage without a thrombus.  Borderline enlargement of main pulmonary artery, approximately 30 mm.  IMPRESSION: 1. Mild CAD of mid LAD, CADRADS = 2. Moderately tortuous coronary arteries.  2. The patient's coronary artery calcium score is 1, which places the patient in the 51 percentile for age and sex matched control.  3. Normal coronary origin with right dominance.   Electronically Signed By: Cherlynn Kaiser On: 09/05/2018 12:35    Assessment/Plan:  1. PAF - s/p recent cardioversion - he remains in NSR - did have PACs post cardioversion. Feels better clinically. No changes made today. I think if he has recurrence - would favor EP referral for ablation.   2. HTN - BP is good here today - no changes made.   3. CAD - prior coronary CT with mild disease noted - he is managed medically - no symptoms - needs aggressive risk factor modification.   4. Possible OSA - I encouraged him to follow thru on this.   5. Lung nodule - this is due for 08/2020 - not discussed today.   6. HLD   7.  Occasional cigar smoking - total cessation encouraged.   8. Obesity   9. Prostate cancer - followed by urology. This was not discussed today.   10. COVID-19 Education: The signs and symptoms of COVID-19 were discussed with the patient and how to seek care for testing (follow up with PCP or arrange E-visit).  The importance of social distancing, staying at home, hand  hygiene and wearing a mask when out in public were discussed today.  Current medicines are reviewed with the patient today.  The patient does not have concerns regarding medicines other than what has been noted above.  The following changes have been made:  See above.  Labs/ tests ordered today include:    Orders Placed This Encounter  Procedures  . EKG 12-Lead     Disposition:   FU with me in 4 months - he will need fasting labs when he comes back.    Patient is agreeable to this plan and will call if any problems develop in the interim.   SignedTruitt Merle, NP  11/16/2019 9:00 AM  [facility] [address] [place], [place]  [address] Phone: [phone] Fax: [phone]

## 2019-11-13 DIAGNOSIS — L509 Urticaria, unspecified: Secondary | ICD-10-CM | POA: Diagnosis not present

## 2019-11-13 DIAGNOSIS — I10 Essential (primary) hypertension: Secondary | ICD-10-CM | POA: Diagnosis not present

## 2019-11-13 DIAGNOSIS — Z79899 Other long term (current) drug therapy: Secondary | ICD-10-CM | POA: Diagnosis not present

## 2019-11-13 DIAGNOSIS — F419 Anxiety disorder, unspecified: Secondary | ICD-10-CM | POA: Diagnosis not present

## 2019-11-14 ENCOUNTER — Other Ambulatory Visit (HOSPITAL_COMMUNITY): Admission: RE | Admit: 2019-11-14 | Payer: BC Managed Care – PPO | Source: Ambulatory Visit

## 2019-11-16 ENCOUNTER — Other Ambulatory Visit: Payer: Self-pay

## 2019-11-16 ENCOUNTER — Encounter: Payer: Self-pay | Admitting: Nurse Practitioner

## 2019-11-16 ENCOUNTER — Ambulatory Visit: Payer: BC Managed Care – PPO | Admitting: Nurse Practitioner

## 2019-11-16 VITALS — BP 128/88 | HR 73 | Ht 73.0 in | Wt 268.8 lb

## 2019-11-16 DIAGNOSIS — Z9889 Other specified postprocedural states: Secondary | ICD-10-CM | POA: Diagnosis not present

## 2019-11-16 DIAGNOSIS — I259 Chronic ischemic heart disease, unspecified: Secondary | ICD-10-CM

## 2019-11-16 DIAGNOSIS — I480 Paroxysmal atrial fibrillation: Secondary | ICD-10-CM | POA: Diagnosis not present

## 2019-11-16 DIAGNOSIS — I10 Essential (primary) hypertension: Secondary | ICD-10-CM

## 2019-11-16 DIAGNOSIS — E7849 Other hyperlipidemia: Secondary | ICD-10-CM

## 2019-11-16 DIAGNOSIS — R0602 Shortness of breath: Secondary | ICD-10-CM

## 2019-11-16 DIAGNOSIS — Z9289 Personal history of other medical treatment: Secondary | ICD-10-CM

## 2019-11-16 DIAGNOSIS — Z7189 Other specified counseling: Secondary | ICD-10-CM

## 2019-11-16 NOTE — Patient Instructions (Addendum)
After Visit Summary:  We will be checking the following labs today - NONE   Medication Instructions:    Continue with your current medicines.    If you need a refill on your cardiac medications before your next appointment, please call your pharmacy.     Testing/Procedures To Be Arranged:  N/A  Follow-Up:   See me in 4 months    At [facility], you and your health needs are our priority.  As part of our continuing mission to provide you with exceptional heart care, we have created designated Provider Care Teams.  These Care Teams include your primary Cardiologist (physician) and Advanced Practice Providers (APPs -  Physician Assistants and Nurse Practitioners) who all work together to provide you with the care you need, when you need it.  Special Instructions:  . Stay safe, stay home, wash your hands for at least 20 seconds and wear a mask when out in public.  . It was good to talk with you today.  . Monitor your pulse - you can look at the "[employer]" - off [employer] the [place] office at [phone] if you have any questions, problems or concerns.

## 2019-11-17 ENCOUNTER — Encounter (HOSPITAL_BASED_OUTPATIENT_CLINIC_OR_DEPARTMENT_OTHER): Payer: BC Managed Care – PPO | Admitting: Cardiology

## 2019-11-28 ENCOUNTER — Telehealth: Payer: Self-pay | Admitting: *Deleted

## 2019-11-28 DIAGNOSIS — R0683 Snoring: Secondary | ICD-10-CM

## 2019-11-28 DIAGNOSIS — I480 Paroxysmal atrial fibrillation: Secondary | ICD-10-CM

## 2019-11-28 NOTE — Telephone Encounter (Signed)
° -----   Message ----- From: [name], CMA Sent: 11/28/2019   5:25 PM EDT To: Chauncey Reading Price Subject: RE: Sleep study                                Yes I did. Pt called to cancel his sleep study. States he will call back to reschedule.) ----- Message ----- From: Earnestine Mealing Sent: 11/02/2019  10:01 AM EDT To: [name], CMA Subject: Sleep study                                    Not sure if you got this sleep study referral a few weeks back.          Split night

## 2020-01-05 DIAGNOSIS — J301 Allergic rhinitis due to pollen: Secondary | ICD-10-CM | POA: Diagnosis not present

## 2020-01-05 DIAGNOSIS — Z6834 Body mass index (BMI) 34.0-34.9, adult: Secondary | ICD-10-CM | POA: Diagnosis not present

## 2020-01-05 DIAGNOSIS — J019 Acute sinusitis, unspecified: Secondary | ICD-10-CM | POA: Diagnosis not present

## 2020-02-22 NOTE — Progress Notes (Deleted)
CARDIOLOGY OFFICE NOTE  Date:  02/22/2020    James Weeks Date of Birth: 10-Jan-1961 Medical Record #[id number]  PCP:  [name], MD (Inactive)  Cardiologist:  Berton Bon chief complaint on file.   History of Present Illness: James Weeks is a 59 y.o. male who presents today for a follow up visit. He followswith me.I am an acquaintance of his wife's.  He has a history ofHTN, HLD, elevated PSA, elevated blood sugar, obesity and anxiety. Looks to have had past intolerance to HCTZ due to gynecomastia.FH is quite + for CAD. He is a smoker- has stopped cigarettes and now smokes cigars.  I met himlast year- has had long standing HTN. Had a spell of presyncope about10 yearsprior- negative stress test and echo at that time - BP very high and he was started on antihypertensive regimen. He has had his HCTZ stopped due to gynecomastia. He self referred himself here for DOE and chest tightness. BP still not controlled. Norvasc was added. Coronary CT obtained. To consider sleep study but he did not really seem to have any issues. Had stopped exercising. Has had elevated PSA - placed on antibiotics andwas to be havingprostate biopsy.  When seen in March for his routine visit - was doing ok - ARB stopped due to alpha gal allergy. Toprol had been added to his regimen. BP fair. He was noted to be in AF at that visit - he was totally asymptomatic - Eliquis was started - echo arranged and plan for cardioversion after a month of anticoagulation.He had restoration back to NSR. Last seen in May - doing well - more energy - did not reschedule his sleep study - wanted a break from all his appointments. Felt to be doing well otherwise.   Comes in today. Here with   Past Medical History:  Diagnosis Date  . Allergic rhinitis due to pollen   . Angina of effort ([place])   . Anxiety   . Body mass index 34.0-34.9, adult   . Elevated blood sugar   . Hemoptysis   .  Hyperlipidemia   . Hypertension   . Nodule of skin of breast   . Obese   . PSA elevation     Past Surgical History:  Procedure Laterality Date  . CARDIOVERSION N/A 10/30/2019   Procedure: CARDIOVERSION;  Surgeon: [name], MD;  Location: [facility] ENDOSCOPY;  Service: Cardiovascular;  Laterality: N/A;  . NO PAST SURGERIES       Medications: No outpatient medications have been marked as taking for the 03/07/20 encounter (Appointment) with Burtis Junes, NP.     Allergies: Allergies  Allergen Reactions  . Alpha-Gal Hives    Red meat    Social History: The patient  reports that he has been smoking cigars. He has been smoking about 0.50 packs per day. He has never used smokeless tobacco. He reports current alcohol use.   Family History: The patient's ***family history includes Asthma in his brother and father; CVA in an other family member; Diabetes in his mother; Heart attack in an other family member; Heart disease in his father and another family member; Hypertension in his mother and another family member.   Review of Systems: Please see the history of present illness.   All other systems are reviewed and negative.   Physical Exam: VS:  There were no vitals taken for this visit. Marland Kitchen  BMI There is no height or weight on file to calculate  BMI.  Wt Readings from Last 3 Encounters:  11/16/19 268 lb 12.8 oz (121.9 kg)  10/30/19 270 lb (122.5 kg)  10/13/19 268 lb 6.4 oz (121.7 kg)    General: Pleasant. Well developed, well nourished and in no acute distress.   HEENT: Normal.  Neck: Supple, no JVD, carotid bruits, or masses noted.  Cardiac: ***Regular rate and rhythm. No murmurs, rubs, or gallops. No edema.  Respiratory:  Lungs are clear to auscultation bilaterally with normal work of breathing.  GI: Soft and nontender.  MS: No deformity or atrophy. Gait and ROM intact.  Skin: Warm and dry. Color is normal.  Neuro:  Strength and sensation are intact and no gross focal  deficits noted.  Psych: Alert, appropriate and with normal affect.   LABORATORY DATA:  EKG:  EKG {ACTION; IS/IS QAE:[id number]} ordered today.  Personally reviewed by me. This demonstrates ***.  Lab Results  Component Value Date   WBC 7.5 10/28/2019   HGB 16.2 10/28/2019   HCT 47.2 10/28/2019   PLT 241 10/28/2019   GLUCOSE 95 10/28/2019   NA 138 10/28/2019   K 4.0 10/28/2019   CL 103 10/28/2019   CREATININE 1.08 10/28/2019   BUN 9 10/28/2019   CO2 25 10/28/2019   TSH 1.850 09/15/2019   INR 1.0 10/28/2019         BNP (last 3 results) No results for input(s): BNP in the last 8760 hours.  ProBNP (last 3 results) No results for input(s): PROBNP in the last 8760 hours.   Other Studies Reviewed Today:  CARDIOVERSION 10/30/2019  Patient anesthetized by anesthesia with Propofol and lidocaine intravenously  With pads in AP position, cardioversion attempted with 200 J synchronized biphasic energy Unsuccessful. Again, with pads in AP position cardioversion attempted with 200 J synchronized biphasic energy but with pressure applied to chest during expiration. Unsuccessful  A new set of pads applied in Apex/Base position. With 200 J synchronized biphasic energy, cardioversion to SR was successful.  Procedures were without complication  12 lead EKG pending  Nevin Bloodgood RossMD   ECHOIMPRESSIONS3/2021  1. Left ventricular ejection fraction, by estimation, is 50 to 55%. The  left ventricle has low normal function. The left ventricle has no regional  wall motion abnormalities. Left ventricular diastolic function could not  be evaluated.  2. Right ventricular systolic function is normal. The right ventricular  size is normal. The estimated right ventricular systolic pressure is 11.0  mmHg.  3. Left atrial size was mildly dilated.  4. The mitral valve is normal in structure. Trivial mitral valve  regurgitation. No evidence of mitral stenosis.  5. The aortic  valve is normal in structure. Aortic valve regurgitation is  not visualized. Mild aortic valve sclerosis is present, with no evidence  of aortic valve stenosis.  6. The inferior vena cava is normal in size with greater than 50%  respiratory variability, suggesting right atrial pressure of 3 mmHg.    CHEST CTIMPRESSION3/2021: 1. Unchanged 4 mm nodule in the left upper lobe, benign. 2. 4 mm nodule in the right upper lobe, not included in the field of view on the prior study. No follow-up needed if patient is low-risk. Non-contrast chest CT can be considered in 12 months if patient is high-risk. This recommendation follows the consensus statement: Guidelines for Management of Incidental Pulmonary Nodules Detected on CT Images: From the Fleischner Society 2017; Radiology 2017; 284:228-243. 3. Emphysema (ICD10-J43.9). 4. Aortic atherosclerosis (ICD10-I70.0).   Electronically Signed By: Titus Dubin M.D. On: 09/07/2019  09:18   CORONARY CTFINDINGS3/2020: Coronary calcium score: The patient's coronary artery calcium score is 1, which places the patient in the 35 percentile.  Coronary arteries: Normal coronary origins. Right dominance.  Moderately tortuous coronary arteries.  Right Coronary Artery: No detectable plaque or stenosis.  Left Main Coronary Artery: No detectable plaque or stenosis.  Left Anterior Descending Coronary Artery: Mild mixed atherosclerotic plaque in the mid LAD (25-49% stenosis), otherwise no significant stenosis.  Left Circumflex Artery: No detectable plaque or stenosis.  Aorta: Normal size, 36 mm at mid ascending aorta (level of PA bifurcation, double oblique measurement). No calcifications. No dissection.  Aortic Valve: No calcifications.  Other findings:  Normal pulmonary vein drainage into the left atrium.  Normal left atrial appendage without a thrombus.  Borderline enlargement of main pulmonary artery,  approximately 30 mm.  IMPRESSION: 1. Mild CAD of mid LAD, CADRADS = 2. Moderately tortuous coronary arteries.  2. The patient's coronary artery calcium score is 1, which places the patient in the 73 percentile for age and sex matched control.  3. Normal coronary origin with right dominance.   Electronically Signed By: Cherlynn Kaiser On: 09/05/2018 12:35    Assessment/Plan:  1. PAF - s/p recent cardioversion - he remains in NSR - did have PACs post cardioversion. Feels better clinically. No changes made today. I think if he has recurrence - would favor EP referral for ablation.   2. HTN - BP is good here today - no changes made.   3. CAD - prior coronary CT with mild disease noted - he is managed medically - no symptoms - needs aggressive risk factor modification.   4. Possible OSA - I encouraged him to follow thru on this.   5. Lung nodule - this is due for 08/2020 - not discussed today.   6. HLD   7.Occasional cigar smoking - total cessation encouraged.   8. Obesity   9. Prostate cancer - followed by urology.This was not discussed today.   Current medicines are reviewed with the patient today.  The patient does not have concerns regarding medicines other than what has been noted above.  The following changes have been made:  See above.  Labs/ tests ordered today include:   No orders of the defined types were placed in this encounter.    Disposition:   FU with *** in {gen number [id number]:[id number]} {Days to years:10300}.   Patient is agreeable to this plan and will call if any problems develop in the interim.   SignedTruitt Merle, NP  02/22/2020 8:41 AM  [facility] [address] [place], [place]  [address] Phone: [phone] Fax: [phone]

## 2020-03-07 ENCOUNTER — Ambulatory Visit: Payer: BC Managed Care – PPO | Admitting: Nurse Practitioner

## 2020-04-11 NOTE — Progress Notes (Signed)
CARDIOLOGY OFFICE NOTE  Date:  04/18/2020    James Weeks Date of Birth: [birth year] Medical Record #[id number]  PCP:  [name], MD  Cardiologist:  Servando Snare   Chief Complaint  Patient presents with  . Follow-up    History of Present Illness: James Weeks is a 59 y.o. male who presents today for a follow up visit. He followswith me.I am an acquaintance of his wife's.  He has a history ofHTN, HLD, elevated PSA, elevated blood sugar, obesity and anxiety. Looks to have had past intolerance to HCTZ due to gynecomastia.FH is quite + for CAD. He is a smoker- has stopped cigarettes and now smokes cigars.  I saw himlast year- has had long standing HTN. Had a spell of presyncope about10 yearsprior- negative stress test and echo at that time - BP very high and he was started on antihypertensive regimen. He has had his HCTZ stopped due to gynecomastia. He self referred himself here for DOE and chest tightness. BP still not controlled. Norvasc was added. Coronary CT obtained. To consider sleep study but he did not really seem to have any issues. Had stopped exercising. Has had elevated PSA - placed on antibiotics andwas to be havingprostate biopsy.  When seen back in March for his routine visit - was doing ok - ARB stopped due to alpha gal allergy. Toprol had been added to his regimen. BP fair. He was noted to be in AF at that visit - he was totally asymptomatic - Eliquis was started - echo arranged and plan for cardioversion after a month of anticoagulation.This was done back in May with restoration back to NSR. I last saw him after his cardioversion - he was doing well - noted more energy and did not really realize how much fatigue he had had prior to cardioversion. Overall felt to be doing ok.   Comes in today. Here alone. He is doing ok. No problems with fatigue. No chest pain. Not short of breath. Not dizzy. BP 125/85 to max of 140/90 - feels good on his  medicines. No recent labs - to get his physical soon with PCP. He feels like he is doing well and has no concerns. Rhythm has been ok - he monitors his heart rate closely.   Past Medical History:  Diagnosis Date  . Allergic rhinitis due to pollen   . Angina of effort ([place])   . Anxiety   . Body mass index 34.0-34.9, adult   . Elevated blood sugar   . Hemoptysis   . Hyperlipidemia   . Hypertension   . Nodule of skin of breast   . Obese   . PSA elevation     Past Surgical History:  Procedure Laterality Date  . CARDIOVERSION N/A 10/30/2019   Procedure: CARDIOVERSION;  Surgeon: [name], MD;  Location: [facility] ENDOSCOPY;  Service: Cardiovascular;  Laterality: N/A;  . NO PAST SURGERIES       Medications: Current Meds  Medication Sig  . amLODipine (NORVASC) 5 MG tablet TAKE 1 TABLET BY MOUTH EVERYDAY AT BEDTIME  . apixaban (ELIQUIS) 5 MG TABS tablet Take 1 tablet (5 mg total) by mouth 2 (two) times daily.  Marland Kitchen aspirin 81 MG EC tablet Take 81 mg by mouth daily. Swallow whole.  . diazepam (VALIUM) 10 MG tablet Take 5 mg by mouth daily as needed for anxiety.   Marland Kitchen EPINEPHrine 0.3 mg/0.3 mL IJ SOAJ injection Inject 1 mg into the muscle as needed for  anaphylaxis.   . fluticasone (FLONASE) 50 MCG/ACT nasal spray Place 50 mcg into the nose daily.   Marland Kitchen loratadine (CLARITIN) 10 MG tablet Take 20 mg by mouth daily.  . metoprolol succinate (TOPROL-XL) 100 MG 24 hr tablet Take 100 mg by mouth at bedtime.   . tamsulosin (FLOMAX) 0.4 MG CAPS capsule Take 0.4 mg by mouth at bedtime.      Allergies: Allergies  Allergen Reactions  . Alpha-Gal Hives    Red meat    Social History: The patient  reports that he has been smoking cigars. He has been smoking about 0.50 packs per day. He has never used smokeless tobacco. He reports current alcohol use.   Family History: The patient's family history includes Asthma in his brother and father; CVA in an other family member; Diabetes in his mother; Heart  attack in an other family member; Heart disease in his father and another family member; Hypertension in his mother and another family member.   Review of Systems: Please see the history of present illness.   All other systems are reviewed and negative.   Physical Exam: VS:  BP (!) 142/90 (BP Location: Left Arm, Patient Position: Sitting, Cuff Size: Large)   Pulse 84   Ht 6\' 1"  (1.854 m)   Wt 268 lb 3.2 oz (121.7 kg)   SpO2 95% Comment: at rest  BMI 35.38 kg/m  .  BMI Body mass index is 35.38 kg/m.  Wt Readings from Last 3 Encounters:  04/18/20 268 lb 3.2 oz (121.7 kg)  11/16/19 268 lb 12.8 oz (121.9 kg)  10/30/19 270 lb (122.5 kg)   BP is 140/90 by me.  General: Pleasant. Alert and in no acute distress.   Cardiac: Regular rate and rhythm. No murmurs, rubs, or gallops. No edema.  Respiratory:  Lungs are clear to auscultation bilaterally with normal work of breathing.  GI: Soft and nontender.  MS: No deformity or atrophy. Gait and ROM intact.  Skin: Warm and dry. Color is normal.  Neuro:  Strength and sensation are intact and no gross focal deficits noted.  Psych: Alert, appropriate and with normal affect.   LABORATORY DATA:  EKG:  EKG is not ordered today.    Lab Results  Component Value Date   WBC 7.5 10/28/2019   HGB 16.2 10/28/2019   HCT 47.2 10/28/2019   PLT 241 10/28/2019   GLUCOSE 95 10/28/2019   NA 138 10/28/2019   K 4.0 10/28/2019   CL 103 10/28/2019   CREATININE 1.08 10/28/2019   BUN 9 10/28/2019   CO2 25 10/28/2019   TSH 1.850 09/15/2019   INR 1.0 10/28/2019         BNP (last 3 results) No results for input(s): BNP in the last 8760 hours.  ProBNP (last 3 results) No results for input(s): PROBNP in the last 8760 hours.   Other Studies Reviewed Today:  CARDIOVERSION 10/30/2019  Patient anesthetized by anesthesia with Propofol and lidocaine intravenously  With pads in AP position, cardioversion attempted with 200 J synchronized biphasic  energy Unsuccessful. Again, with pads in AP position cardioversion attempted with 200 J synchronized biphasic energy but with pressure applied to chest during expiration. Unsuccessful  A new set of pads applied in Apex/Base position. With 200 J synchronized biphasic energy, cardioversion to SR was successful.  Procedures were without complication  12 lead EKG pending  Nevin Bloodgood RossMD   ECHOIMPRESSIONS3/2021  1. Left ventricular ejection fraction, by estimation, is 50 to 55%. The  left  ventricle has low normal function. The left ventricle has no regional  wall motion abnormalities. Left ventricular diastolic function could not  be evaluated.  2. Right ventricular systolic function is normal. The right ventricular  size is normal. The estimated right ventricular systolic pressure is 74.2  mmHg.  3. Left atrial size was mildly dilated.  4. The mitral valve is normal in structure. Trivial mitral valve  regurgitation. No evidence of mitral stenosis.  5. The aortic valve is normal in structure. Aortic valve regurgitation is  not visualized. Mild aortic valve sclerosis is present, with no evidence  of aortic valve stenosis.  6. The inferior vena cava is normal in size with greater than 50%  respiratory variability, suggesting right atrial pressure of 3 mmHg.    CHEST CTIMPRESSION3/2021: 1. Unchanged 4 mm nodule in the left upper lobe, benign. 2. 4 mm nodule in the right upper lobe, not included in the field of view on the prior study. No follow-up needed if patient is low-risk. Non-contrast chest CT can be considered in 12 months if patient is high-risk. This recommendation follows the consensus statement: Guidelines for Management of Incidental Pulmonary Nodules Detected on CT Images: From the Fleischner Society 2017; Radiology 2017; 284:228-243. 3. Emphysema (ICD10-J43.9). 4. Aortic atherosclerosis (ICD10-I70.0).   Electronically Signed By: Titus Dubin M.D. On: 09/07/2019 09:18   CORONARY CTFINDINGS3/2020: Coronary calcium score: The patient's coronary artery calcium score is 1, which places the patient in the 35 percentile.  Coronary arteries: Normal coronary origins. Right dominance.  Moderately tortuous coronary arteries.  Right Coronary Artery: No detectable plaque or stenosis.  Left Main Coronary Artery: No detectable plaque or stenosis.  Left Anterior Descending Coronary Artery: Mild mixed atherosclerotic plaque in the mid LAD (25-49% stenosis), otherwise no significant stenosis.  Left Circumflex Artery: No detectable plaque or stenosis.  Aorta: Normal size, 36 mm at mid ascending aorta (level of PA bifurcation, double oblique measurement). No calcifications. No dissection.  Aortic Valve: No calcifications.  Other findings:  Normal pulmonary vein drainage into the left atrium.  Normal left atrial appendage without a thrombus.  Borderline enlargement of main pulmonary artery, approximately 30 mm.  IMPRESSION: 1. Mild CAD of mid LAD, CADRADS = 2. Moderately tortuous coronary arteries.  2. The patient's coronary artery calcium score is 1, which places the patient in the 73 percentile for age and sex matched control.  3. Normal coronary origin with right dominance.   Electronically Signed By: Cherlynn Kaiser On: 09/05/2018 12:35    Assessment/Plan:  1. PAF - prior cardioversion - remains in NSR by exam. He is monitoring at home as well. Remains on Eliquis.   2. HTN - BP ok at home - he is to continue to monitor - no changes made today.   3. CAD - mild by prior CT scan - needs aggressive CV risk factor modification.   4. Possible OSA - he has been reluctant to follow thru on sleep study.   5. Lung nodule - due for a scan in March of 2022 - not discussed today.   6. HLD - not on therapy - needs to be with known CAD - he will have labs by PCP and then send to  me for our review.   7. Obesity   8. Prostate cancer - he is followed by urology.   Current medicines are reviewed with the patient today.  The patient does not have concerns regarding medicines other than what has been noted above.  The  following changes have been made:  See above.  Labs/ tests ordered today include:   No orders of the defined types were placed in this encounter.    Disposition:   FU with Dr. Johney Frame in 6 months. He is aware that I am leaving in February. He is to send me a copy of his upcoming labs.     Patient is agreeable to this plan and will call if any problems develop in the interim.   SignedTruitt Merle, NP  04/18/2020 3:18 PM  [facility] [address] [place], [place]  [address] Phone: [phone] Fax: [phone]

## 2020-04-18 ENCOUNTER — Ambulatory Visit: Payer: BC Managed Care – PPO | Admitting: Nurse Practitioner

## 2020-04-18 ENCOUNTER — Encounter: Payer: Self-pay | Admitting: Nurse Practitioner

## 2020-04-18 ENCOUNTER — Other Ambulatory Visit: Payer: Self-pay

## 2020-04-18 VITALS — BP 142/90 | HR 84 | Ht 73.0 in | Wt 268.2 lb

## 2020-04-18 DIAGNOSIS — E7849 Other hyperlipidemia: Secondary | ICD-10-CM | POA: Diagnosis not present

## 2020-04-18 DIAGNOSIS — I259 Chronic ischemic heart disease, unspecified: Secondary | ICD-10-CM

## 2020-04-18 DIAGNOSIS — Z9889 Other specified postprocedural states: Secondary | ICD-10-CM | POA: Diagnosis not present

## 2020-04-18 DIAGNOSIS — I480 Paroxysmal atrial fibrillation: Secondary | ICD-10-CM

## 2020-04-18 DIAGNOSIS — I10 Essential (primary) hypertension: Secondary | ICD-10-CM

## 2020-04-18 DIAGNOSIS — R911 Solitary pulmonary nodule: Secondary | ICD-10-CM

## 2020-04-18 NOTE — Patient Instructions (Addendum)
After Visit Summary:  We will be checking the following labs today - NONE  Please get a copy of your labs sent to me with your physical   Medication Instructions:    Continue with your current medicines.    If you need a refill on your cardiac medications before your next appointment, please call your pharmacy.     Testing/Procedures To Be Arranged:  N/A  Follow-Up:   See Dr. Johney Frame in 6 months    At [facility], you and your health needs are our priority.  As part of our continuing mission to provide you with exceptional heart care, we have created designated Provider Care Teams.  These Care Teams include your primary Cardiologist (physician) and Advanced Practice Providers (APPs -  Physician Assistants and Nurse Practitioners) who all work together to provide you with the care you need, when you need it.  Special Instructions:  . Stay safe, wash your hands for at least 20 seconds and wear a mask when needed.  . It was good to talk with you today.    Call the [place] office at [phone] if you have any questions, problems or concerns.

## 2020-05-16 DIAGNOSIS — Z91018 Allergy to other foods: Secondary | ICD-10-CM | POA: Diagnosis not present

## 2020-05-16 DIAGNOSIS — Z0000 Encounter for general adult medical examination without abnormal findings: Secondary | ICD-10-CM | POA: Diagnosis not present

## 2020-05-16 DIAGNOSIS — R739 Hyperglycemia, unspecified: Secondary | ICD-10-CM | POA: Diagnosis not present

## 2020-05-16 DIAGNOSIS — E785 Hyperlipidemia, unspecified: Secondary | ICD-10-CM | POA: Diagnosis not present

## 2020-05-31 ENCOUNTER — Ambulatory Visit (INDEPENDENT_AMBULATORY_CARE_PROVIDER_SITE_OTHER): Payer: BC Managed Care – PPO | Admitting: Allergy

## 2020-05-31 ENCOUNTER — Encounter: Payer: Self-pay | Admitting: Allergy

## 2020-05-31 ENCOUNTER — Other Ambulatory Visit: Payer: Self-pay

## 2020-05-31 VITALS — BP 140/80 | HR 75 | Temp 97.9°F | Resp 16 | Ht 72.0 in | Wt 270.6 lb

## 2020-05-31 DIAGNOSIS — T7800XA Anaphylactic reaction due to unspecified food, initial encounter: Secondary | ICD-10-CM

## 2020-05-31 DIAGNOSIS — J310 Chronic rhinitis: Secondary | ICD-10-CM

## 2020-05-31 MED ORDER — EPINEPHRINE 0.3 MG/0.3ML IJ SOAJ: 1.0000 mg | INTRAMUSCULAR | 1 refills | Status: DC | PRN

## 2020-05-31 NOTE — Progress Notes (Signed)
Follow-up Note  RE: James Weeks MRN: [id number] DOB: 07-Jun-1961 Date of Office Visit: 05/31/2020   History of present illness: James Weeks is a 59 y.o. male presenting today for follow-up of alpha gal allergy and rhinitis.  He was last seen in the office on 06/09/2019 myself.  He states he has done well over the past year.  He has been very strict and avoiding red meat products.  He has not required use of his epinephrine device.  He is able to tolerate dairy products in the diet without issue.  He has not had any tick bites in the past year. He states he still has some nasal drainage and congestion with exposure to strong odors and during pollen season.  He will use Flonase as needed during pollen season primarily.  He also will take a long-acting antihistamine like Claritin as needed.  He had alpha gal IgE panel done on 05/16/2020 that showed an KU/liter: Alpha gal IgE 0.96, beef IgE 0.39, lamb 0.29, pork 0.19.  These values are all much lower than they were a year ago.     Review of systems: Review of Systems  Constitutional: Negative.   HENT: Negative.   Eyes: Negative.   Respiratory: Negative.   Cardiovascular: Negative.   Gastrointestinal: Negative.   Musculoskeletal: Negative.   Skin: Negative.   Neurological: Negative.     All other systems negative unless noted above in HPI  Past medical/social/surgical/family history have been reviewed and are unchanged unless specifically indicated below.  No changes  Medication List: Current Outpatient Medications  Medication Sig Dispense Refill  . amLODipine (NORVASC) 5 MG tablet TAKE 1 TABLET BY MOUTH EVERYDAY AT BEDTIME 90 tablet 3  . apixaban (ELIQUIS) 5 MG TABS tablet Take 1 tablet (5 mg total) by mouth 2 (two) times daily. 60 tablet 11  . aspirin 81 MG EC tablet Take 81 mg by mouth daily. Swallow whole.    . diazepam (VALIUM) 10 MG tablet Take 5 mg by mouth daily as needed for anxiety.     Marland Kitchen EPINEPHrine  0.3 mg/0.3 mL IJ SOAJ injection Inject 1 mg into the muscle as needed for anaphylaxis.     . fluticasone (FLONASE) 50 MCG/ACT nasal spray Place 50 mcg into the nose daily.     Marland Kitchen loratadine (CLARITIN) 10 MG tablet Take 20 mg by mouth daily.    . metoprolol succinate (TOPROL-XL) 100 MG 24 hr tablet Take 100 mg by mouth at bedtime.     . tamsulosin (FLOMAX) 0.4 MG CAPS capsule Take 0.4 mg by mouth at bedtime.      No current facility-administered medications for this visit.     Known medication allergies: Allergies  Allergen Reactions  . Alpha-Gal Hives    Red meat     Physical examination: Blood pressure 140/80, pulse 75, temperature 97.9 F (36.6 C), temperature source Tympanic, resp. rate 16, height 6' (1.829 m), weight 270 lb 9.6 oz (122.7 kg), SpO2 96 %.  General: Alert, interactive, in no acute distress. HEENT: PERRLA, TMs pearly gray, turbinates non-edematous without discharge, post-pharynx non erythematous. Neck: Supple without lymphadenopathy. Lungs: Clear to auscultation without wheezing, rhonchi or rales. {no increased work of breathing. CV: Normal S1, S2 without murmurs. Abdomen: Nondistended, nontender. Skin: Warm and dry, without lesions or rashes. Extremities:  No clubbing, cyanosis or edema. Neuro:   Grossly intact.  Diagnositics/Labs: Labs: See HPI  Assessment and plan:    Alpha-gal (Red Meat) Allergy   - continue  avoidance of red meat products in the diet until challenge day.  Discussed today that his IgE levels to alpha gal have dropped tremendously over the past year.  He is now in a range for we can consider doing a formal in office challenge.  Recommend scheduling for food challenge to red meat.  On day of challenge bring in red meat product you wish to eat cooked (ie. Cooked hamburger patty).  Advised to be in the office for the challenge for the 6 to 8 hours.  He will not take any antihistamines for 3 days or so prior  - have access to self-injectable  epinephrine Epipen 0.3mg  at all times  - follow emergency action plan in case of allergic reaction  Rhinitis  - continue to avoid strong odors (ie perfumes) as much as possible  - continue Claritin 10mg  daily as needed  - continue Flonase 2 sprays each nostril daily as needed for nasal congestion/drainage  Follow-up for food challenge  I appreciate the opportunity to take part in Jammie's care. Please do not hesitate to contact me with questions.  Sincerely,   Prudy Feeler, MD Allergy/Immunology [facility]

## 2020-05-31 NOTE — Patient Instructions (Addendum)
Alpha-gal and Red Meat Allergy   - continue avoidance of red meat products in the diet until challenge day.  Alpha gal panel levels all much lower than last year and under cut off for food challenge.  Recommend scheduling for food challenge to red meat.  On day of challenge bring in red meat product you wish to eat cooked (ie. Cooked hamburger patty)  - have access to self-injectable epinephrine Epipen 0.3mg  at all times  - follow emergency action plan in case of allergic reaction  Rhinitis  - continue to avoid strong odors (ie perfumes) as much as possible  - continue Claritin 10mg  daily as needed  - continue Flonase 2 sprays each nostril daily as needed for nasal congestion/drainage  Follow-up for food challenge

## 2020-06-21 ENCOUNTER — Encounter: Payer: BC Managed Care – PPO | Admitting: Allergy

## 2020-06-22 ENCOUNTER — Encounter: Payer: Self-pay | Admitting: *Deleted

## 2020-06-28 ENCOUNTER — Other Ambulatory Visit: Payer: Self-pay

## 2020-06-28 ENCOUNTER — Encounter: Payer: Self-pay | Admitting: Allergy

## 2020-06-28 ENCOUNTER — Ambulatory Visit (INDEPENDENT_AMBULATORY_CARE_PROVIDER_SITE_OTHER): Payer: BC Managed Care – PPO | Admitting: Allergy

## 2020-06-28 VITALS — BP 140/86 | HR 85 | Resp 16

## 2020-06-28 DIAGNOSIS — T7800XD Anaphylactic reaction due to unspecified food, subsequent encounter: Secondary | ICD-10-CM

## 2020-06-28 DIAGNOSIS — T7800XA Anaphylactic reaction due to unspecified food, initial encounter: Secondary | ICD-10-CM

## 2020-06-28 DIAGNOSIS — J310 Chronic rhinitis: Secondary | ICD-10-CM

## 2020-06-28 NOTE — Progress Notes (Signed)
Follow-up Note  RE: James Weeks MRN: [id number] DOB: 1961-02-17 Date of Office Visit: 06/28/2020   History of present illness: James Weeks is a 60 y.o. male presenting today for oral challenge to beef.  He was last seen in the office on 05/31/2020 by myself.  He is in his normal state of health today without any recent illnesses.  He has not had any major health changes, surgeries or hospitalizations since his last visit. He has held antihistamines for challenge today.  He has prepared a beef patty for challenge today.   He had alpha gal IgE panel done on 05/16/2020 that showed an KU/liter: Alpha gal IgE 0.96, beef IgE 0.39, lamb 0.29, pork 0.19.   Review of systems: Review of Systems  Constitutional: Negative.   HENT: Negative.   Eyes: Negative.   Respiratory: Negative.   Cardiovascular: Negative.   Gastrointestinal: Negative.   Musculoskeletal: Negative.   Skin: Negative.   Neurological: Negative.     All other systems negative unless noted above in HPI  Past medical/social/surgical/family history have been reviewed and are unchanged unless specifically indicated below.  No changes  Medication List: Current Outpatient Medications  Medication Sig Dispense Refill  . amLODipine (NORVASC) 5 MG tablet TAKE 1 TABLET BY MOUTH EVERYDAY AT BEDTIME 90 tablet 3  . apixaban (ELIQUIS) 5 MG TABS tablet Take 1 tablet (5 mg total) by mouth 2 (two) times daily. 60 tablet 11  . aspirin 81 MG EC tablet Take 81 mg by mouth daily. Swallow whole.    . diazepam (VALIUM) 10 MG tablet Take 5 mg by mouth daily as needed for anxiety.     Marland Kitchen EPINEPHrine 0.3 mg/0.3 mL IJ SOAJ injection Inject 1 mg into the muscle as needed for anaphylaxis. 2 each 1  . fluticasone (FLONASE) 50 MCG/ACT nasal spray Place 50 mcg into the nose daily.    Marland Kitchen loratadine (CLARITIN) 10 MG tablet Take 20 mg by mouth daily.    . metoprolol succinate (TOPROL-XL) 100 MG 24 hr tablet Take 100 mg by mouth at bedtime.      . tamsulosin (FLOMAX) 0.4 MG CAPS capsule Take 0.4 mg by mouth at bedtime.      No current facility-administered medications for this visit.     Known medication allergies: Allergies  Allergen Reactions  . Alpha-Gal Hives    Red meat     Physical examination: Blood pressure 140/86, pulse 85, resp. rate 16, SpO2 96 %.  General: Alert, interactive, in no acute distress. HEENT: PERRLA, TMs pearly gray, turbinates non-edematous without discharge, post-pharynx non erythematous. Neck: Supple without lymphadenopathy. Lungs: Clear to auscultation without wheezing, rhonchi or rales. {no increased work of breathing. CV: Normal S1, S2 without murmurs. Abdomen: Nondistended, nontender. Skin: Warm and dry, without lesions or rashes. Extremities:  No clubbing, cyanosis or edema. Neuro:   Grossly intact.  Diagnositics/Labs: Food challenge to alpha gal with use of a beef hamburger patty. Benefits and risks of challenge discussed and consent obtained.  He was provided with one fourth of patty followed by three fourths of a patty with 20-minute interval.  He consumed a total of one hamburger patty.  he was then observed for a total of 6 hours.  He had no signs/symptoms of allergic reaction.  Vitals were obtained prior to discharge and remained stable.    Assessment and plan:    Alpha-gal/Red Meat Allergy   -food challenge to red meat performed today and successfully passed.  Do not eat  any further red meat products today.  -can incorporate red meat products into your diet moving forward starting tomorrow.  Continue to be very cautious for symptoms of allergic reaction following red meat ingestion.  Continue to have access to your epinephrine device for at least the next 6 months while reincorporating red meat into your diet.  -follow emergency action plan in case of allergic reaction  -do your best to avoid any future tick bites  Rhinitis  - continue to avoid strong odors (ie perfumes) as  much as possible  - continue Claritin 10mg  daily as needed  - continue Flonase 2 sprays each nostril daily as needed for nasal congestion/drainage  Follow-up in as needed or in 1 year if needed I appreciate the opportunity to take part in Armona's care. Please do not hesitate to contact me with questions.  Sincerely,   , MD Allergy/Immunology [facility]

## 2020-06-28 NOTE — Patient Instructions (Addendum)
Alpha-gal/Red Meat Allergy   -food challenge to red meat performed today and successfully passed.  Do not eat any further red meat products today.  -can incorporate red meat products into your diet moving forward starting tomorrow.  Continue to be very cautious for symptoms of allergic reaction following red meat ingestion.  Continue to have access to your epinephrine device for at least the next 6 months while reincorporating red meat into your diet.  -follow emergency action plan in case of allergic reaction  -do your best to avoid any future tick bites  Rhinitis  - continue to avoid strong odors (ie perfumes) as much as possible  - continue Claritin 10mg  daily as needed  - continue Flonase 2 sprays each nostril daily as needed for nasal congestion/drainage  Follow-up in as needed or in 1 year if needed

## 2020-06-29 DIAGNOSIS — L821 Other seborrheic keratosis: Secondary | ICD-10-CM | POA: Diagnosis not present

## 2020-06-29 DIAGNOSIS — D225 Melanocytic nevi of trunk: Secondary | ICD-10-CM | POA: Diagnosis not present

## 2020-07-21 DIAGNOSIS — R351 Nocturia: Secondary | ICD-10-CM | POA: Diagnosis not present

## 2020-07-21 DIAGNOSIS — C61 Malignant neoplasm of prostate: Secondary | ICD-10-CM | POA: Diagnosis not present

## 2020-07-26 ENCOUNTER — Other Ambulatory Visit: Payer: Self-pay | Admitting: Urology

## 2020-07-26 DIAGNOSIS — C61 Malignant neoplasm of prostate: Secondary | ICD-10-CM

## 2020-08-16 ENCOUNTER — Other Ambulatory Visit: Payer: Self-pay

## 2020-08-16 ENCOUNTER — Ambulatory Visit
Admission: RE | Admit: 2020-08-16 | Discharge: 2020-08-16 | Disposition: A | Discharge status: Home / Self Care | Payer: BC Managed Care – PPO | Source: Ambulatory Visit | Attending: Urology | Admitting: Urology

## 2020-08-16 DIAGNOSIS — C61 Malignant neoplasm of prostate: Secondary | ICD-10-CM | POA: Diagnosis not present

## 2020-08-16 DIAGNOSIS — I10 Essential (primary) hypertension: Secondary | ICD-10-CM | POA: Diagnosis not present

## 2020-08-16 DIAGNOSIS — E785 Hyperlipidemia, unspecified: Secondary | ICD-10-CM | POA: Diagnosis not present

## 2020-08-16 DIAGNOSIS — Z6835 Body mass index (BMI) 35.0-35.9, adult: Secondary | ICD-10-CM | POA: Diagnosis not present

## 2020-08-16 DIAGNOSIS — R739 Hyperglycemia, unspecified: Secondary | ICD-10-CM | POA: Diagnosis not present

## 2020-08-16 MED ORDER — GADOBENATE DIMEGLUMINE 529 MG/ML IV SOLN
20.0000 mL | Freq: Once | INTRAVENOUS | Status: AC | PRN
  Administered 2020-08-16: 20 mL via INTRAVENOUS

## 2020-09-08 ENCOUNTER — Other Ambulatory Visit: Payer: Self-pay

## 2020-09-08 MED ORDER — AMLODIPINE BESYLATE 5 MG PO TABS: ORAL_TABLET | ORAL | 1 refills | Status: DC

## 2020-09-08 NOTE — Telephone Encounter (Signed)
Pt's medication was sent to pt's pharmacy as requested. Confirmation received.  °

## 2020-09-13 NOTE — Progress Notes (Unsigned)
Cardiology Office Note:    Date:  09/15/2020   ID:  James Weeks, DOB 1960/10/08, MRN [id number]  PCP:  James Doyne., MD   [place]  Cardiologist:  No primary care provider on file.  Advanced Practice Provider:  [name] to display Electrophysiologist:  None  :[id number]}   Referring MD: James Doyne., MD    History of Present Illness:    James Weeks is a 60 y.o. male with a hx of HTN, HLD, elevated PSA, atrial fibrillation and anxiety who was previously followed by James Weeks now returning to clinic for follow-up.   Patient was followed by [name] over the past several years. Had a spell of presyncope about10 yearspriorwith negative stress test and echo at that time . BP very high and he was started on antihypertensive regimen. He has had his HCTZ stopped due to gynecomastia. He self referred himself here for DOE and chest tightness. BP still not controlled. Norvasc was added. Coronary CT obtained in 2020 which showed no significant CAD and coronary calcium score of 1. Was seen in 08/2019 where he was noted to be in new onset afib. He was asymptomatic at that time and started on apixaban. He underwent DCCV in 10/2019 with return to NSR. TTE with LVEF 50-55%, mild LAE, no significant valve disease.  Last seen in clinic by [facility] on 04/18/20 where he was doing well. No chest pain or palpitations. Blood pressure and HR well controlled.  Patient states that he is doing overall well. No chest pain, SOB, palpitations, fatigue. He is more active now that the weather is better and has been working in the yard. Blood pressure 120-130s at home. He is working on diet and exercise to try to lower his cholesterol with LDL 118, HDL 33.  Past Medical History:  Diagnosis Date  . Allergic rhinitis due to pollen   . Angina of effort ([place])   . Anxiety   . Body mass index 34.0-34.9, adult   . Elevated blood sugar   . Hemoptysis   . Hyperlipidemia    . Hypertension   . Nodule of skin of breast   . Obese   . PSA elevation     Past Surgical History:  Procedure Laterality Date  . CARDIOVERSION N/A 10/30/2019   Procedure: CARDIOVERSION;  Surgeon: James Records, MD;  Location: [facility] ENDOSCOPY;  Service: Cardiovascular;  Laterality: N/A;  . NO PAST SURGERIES      Current Medications: Current Meds  Medication Sig  . amLODipine (NORVASC) 5 MG tablet TAKE 1 TABLET BY MOUTH EVERYDAY AT BEDTIME  . apixaban (ELIQUIS) 5 MG TABS tablet Take 1 tablet (5 mg total) by mouth 2 (two) times daily.  . diazepam (VALIUM) 10 MG tablet Take 5 mg by mouth daily as needed for anxiety.   Marland Kitchen EPINEPHrine 0.3 mg/0.3 mL IJ SOAJ injection Inject 1 mg into the muscle as needed for anaphylaxis.  . fluticasone (FLONASE) 50 MCG/ACT nasal spray Place 50 mcg into the nose daily.  Marland Kitchen loratadine (CLARITIN) 10 MG tablet Take 20 mg by mouth daily.  . metoprolol succinate (TOPROL-XL) 100 MG 24 hr tablet Take 100 mg by mouth at bedtime.   . tamsulosin (FLOMAX) 0.4 MG CAPS capsule Take 0.4 mg by mouth at bedtime.   . [DISCONTINUED] aspirin 81 MG EC tablet Take 81 mg by mouth daily. Swallow whole.     Allergies:   Alpha-gal   Social History   Socioeconomic  History  . Marital status: Married    Spouse name: Not on file  . Number of children: Not on file  . Years of education: Not on file  . Highest education level: Not on file  Occupational History  . Not on file  Tobacco Use  . Smoking status: Current Some Day Smoker    Packs/day: 0.50    Types: Cigars  . Smokeless tobacco: Never Used  Vaping Use  . Vaping Use: Never used  Substance and Sexual Activity  . Alcohol use: Yes    Comment: occ on weekends  . Drug use: Never  . Sexual activity: Not on file  Other Topics Concern  . Not on file  Social History Narrative  . Not on file   Social Determinants of Health   Financial Resource Strain: Not on file  Food Insecurity: Not on file  Transportation Needs: Not  on file  Physical Activity: Not on file  Stress: Not on file  Social Connections: Not on file     Family History: The patient's family history includes Asthma in his brother and father; CVA in an other family member; Diabetes in his mother; Heart attack in an other family member; Heart disease in his father and another family member; Hypertension in his mother and another family member.  ROS:   Please see the history of present illness.    Review of Systems  Constitutional: Negative for chills and fever.  HENT: Negative for hearing loss and sore throat.   Eyes: Negative for blurred vision and redness.  Respiratory: Negative for shortness of breath.   Cardiovascular: Negative for chest pain, palpitations, orthopnea, claudication, leg swelling and PND.  Gastrointestinal: Negative for blood in stool, melena, nausea and vomiting.  Genitourinary: Negative for hematuria.  Musculoskeletal: Negative for falls.  Neurological: Negative for dizziness and loss of consciousness.  Endo/Heme/Allergies: Negative for polydipsia.  Psychiatric/Behavioral: Negative for substance abuse.    EKGs/Labs/Other Studies Reviewed:    The following studies were reviewed today: Coronary CTA 09/05/18: FINDINGS: Coronary calcium score: The patient's coronary artery calcium score is 1, which places the patient in the 35 percentile.  Coronary arteries: Normal coronary origins.  Right dominance.  Moderately tortuous coronary arteries.  Right Coronary Artery: No detectable plaque or stenosis.  Left Main Coronary Artery: No detectable plaque or stenosis.  Left Anterior Descending Coronary Artery: Mild mixed atherosclerotic plaque in the mid LAD (25-49% stenosis), otherwise no significant stenosis.  Left Circumflex Artery: No detectable plaque or stenosis.  Aorta: Normal size, 36 mm at mid ascending aorta (level of PA bifurcation, double oblique measurement). No calcifications.  No dissection.  Aortic Valve: No calcifications.  Other findings:  Normal pulmonary vein drainage into the left atrium.  Normal left atrial appendage without a thrombus.  Borderline enlargement of main pulmonary artery, approximately 30 mm.  IMPRESSION: 1. Mild CAD of mid LAD, CADRADS = 2. Moderately tortuous coronary arteries.  2. The patient's coronary artery calcium score is 1, which places the patient in the 32 percentile for age and sex matched control.  3.  Normal coronary origin with right dominance.  TTE 09/23/19: IMPRESSIONS  1. Left ventricular ejection fraction, by estimation, is 50 to 55%. The  left ventricle has low normal function. The left ventricle has no regional  wall motion abnormalities. Left ventricular diastolic function could not  be evaluated.  2. Right ventricular systolic function is normal. The right ventricular  size is normal. The estimated right ventricular systolic pressure is 26.4  mmHg.  3. Left atrial size was mildly dilated.  4. The mitral valve is normal in structure. Trivial mitral valve  regurgitation. No evidence of mitral stenosis.  5. The aortic valve is normal in structure. Aortic valve regurgitation is  not visualized. Mild aortic valve sclerosis is present, with no evidence  of aortic valve stenosis.  6. The inferior vena cava is normal in size with greater than 50%  respiratory variability, suggesting right atrial pressure of 3 mmHg.   CARDIOVERSION5/12/2019  Patient anesthetized by anesthesia with Propofol and lidocaine intravenously  With pads in AP position, cardioversion attempted with 200 J synchronized biphasic energy Unsuccessful. Again, with pads in AP position cardioversion attempted with 200 J synchronized biphasic energy but with pressure applied to chest during expiration. Unsuccessful  A new set of pads applied in Apex/Base position. With 200 J synchronized biphasic energy, cardioversion to  SR was successful.  Procedures were without complication  CHEST [id number]: 1. Unchanged 4 mm nodule in the left upper lobe, benign. 2. 4 mm nodule in the right upper lobe, not included in the field of view on the prior study. No follow-up needed if patient is low-risk. Non-contrast chest CT can be considered in 12 months if patient is high-risk. This recommendation follows the consensus statement: Guidelines for Management of Incidental Pulmonary Nodules Detected on CT Images: From the Fleischner Society 2017; Radiology 2017; 284:228-243. 3. Emphysema (ICD10-J43.9). 4. Aortic atherosclerosis (ICD10-I70.0).   EKG:  EKG is  ordered today.  The ekg ordered today demonstrates NSR with HR 84  Recent Labs: 10/28/2019: BUN 9; Creatinine, Ser 1.08; Hemoglobin 16.2; Platelets 241; Potassium 4.0; Sodium 138  Recent Lipid Panel No results found for: CHOL, TRIG, HDL, CHOLHDL, VLDL, LDLCALC, LDLDIRECT   Risk Assessment/Calculations:    CHA2DS2-VASc Score = 2  This indicates a 2.2% annual risk of stroke. The patient's score is based upon: CHF History: No HTN History: Yes Diabetes History: No Stroke History: No Vascular Disease History: Yes Age Score: 0 Gender Score: 0    Physical Exam:    VS:  BP 130/90 (BP Location: Left Arm, Patient Position: Sitting, Cuff Size: Normal)   Pulse 84   Ht 6' (1.829 m)   Wt 272 lb (123.4 kg)   SpO2 94%   BMI 36.89 kg/m     Wt Readings from Last 3 Encounters:  09/15/20 272 lb (123.4 kg)  05/31/20 270 lb 9.6 oz (122.7 kg)  04/18/20 268 lb 3.2 oz (121.7 kg)     GEN:  Well nourished, well developed in no acute distress HEENT: Normal NECK: No JVD; No carotid bruits CARDIAC: RRR, no murmurs, rubs, gallops RESPIRATORY:  Clear to auscultation without rales, wheezing or rhonchi  ABDOMEN: Soft, non-tender, non-distended MUSCULOSKELETAL:  No edema; No deformity  SKIN: Warm and dry NEUROLOGIC:  Alert and oriented x 3 PSYCHIATRIC:   Normal affect   ASSESSMENT:    1. PAF (paroxysmal atrial fibrillation) ([place])   2. Hyperlipidemia, unspecified hyperlipidemia type   3. History of cardioversion   4. Essential hypertension   5. Coronary artery disease involving native coronary artery of native heart without angina pectoris   6. Lung nodule   7. Obesity (BMI 35.0-39.9 without comorbidity)    PLAN:    In order of problems listed above:  #PAF  CHADs-vasc 2. Initially diagnosed incidentally on exam by James Weeks in 08/2019. Was having fatigue at that time. Was placed on metop and apixaban for [facility]. Underwent DCCV 10/2019 with return to NSR. Remains  in NSR today with no palpitations. Tolerating the apixaban well. -Continue metop 100mg  XL daily -Continue apixaban 5mg  BID  -Okay to hold apixaban prior to prostate biopsy and resume once cleared by surgery team--no bridging needed  #HTN: -Continue amlodipine 5mg  daily -Continue metoprolol 100mg  XL daily  #Mild CAD: Mild on coronary CTA with calcium score 1. -Stop ASA given need for apixaban -Wants to do trial of lifestyle prior to starting statin; goal LDL <70 -Repeat lipids in 3 months  #Possible OSA: -Declined sleep study  #Lung nodule  -Will repeat screening CT scan to monitor scan  #HLD: Patient has LDL 118. We discussed how he merits statin therapy. He really wants to trial lifestyle modifications for 3 months and then only start if he is not at goal. -Declined statin -35months of lifestyle modifications -Check lipid panel   #Obesity  -Discussed diet and exercise at length today  #Prostate cancer: Planned for prostate biopsy. -Okay to hold apixaban for biopsy and resume post-procedure; no bridging needed  Exercise recommendations: Goal of exercising for at least 30 minutes a day, at least 5 times per week.  Please exercise to a moderate exertion.  This means that while exercising it is difficult to speak in full sentences, however you are not so  short of breath that you feel you must stop, and not so comfortable that you can carry on a full conversation.  Exertion level should be approximately a 5/10, if 10 is the most exertion you can perform.  Diet recommendations: Recommend a heart healthy diet such as the Mediterranean diet.  This diet consists of plant based foods, healthy fats, lean meats, olive oil.  It suggests limiting the intake of simple carbohydrates such as white breads, pastries, and pastas.  It also limits the amount of red meat, wine, and dairy products such as cheese that one should consume on a daily basis.        Medication Adjustments/Labs and Tests Ordered: Current medicines are reviewed at length with the patient today.  Concerns regarding medicines are outlined above.  Orders Placed This Encounter  Procedures  . CT Chest High Resolution  . Lipid panel  . EKG 12-Lead   No orders of the defined types were placed in this encounter.   Patient Instructions   Medication Instructions:  Stop Aspirin Your physician recommends that you continue on your current medications as directed. Please refer to the Current Medication list given to you today. *If you need a refill on your cardiac medications before your next appointment, please call your pharmacy*   Lab Work: Lipids in 3 months If you have labs (blood work) drawn today and your tests are completely normal, you will receive your results only by: Marland Kitchen MyChart Message (if you have MyChart) OR . A paper copy in the mail If you have any lab test that is abnormal or we need to change your treatment, we will call you to review the results.   Testing/Procedures: Patient can hold Apixaban for procedure per Dr. Gwyndolyn Kaufman and can restart after procedure according to surgeon  Provider suggested a High Resolution Chest CT for Pulmonary Nodule  Follow-Up:  At [facility], you and your health needs are our priority.  As part of our continuing mission to  provide you with exceptional heart care, we have created designated Provider Care Teams.  These Care Teams include your primary Cardiologist (physician) and Advanced Practice Providers (APPs -  Physician Assistants and Nurse Practitioners) who all work together to  provide you with the care you need, when you need it.  We recommend signing up for the patient portal called "MyChart".  Sign up information is provided on this After Visit Summary.  MyChart is used to connect with patients for Virtual Visits (Telemedicine).  Patients are able to view lab/test results, encounter notes, upcoming appointments, etc.  Non-urgent messages can be sent to your provider as well.   To learn more about what you can do with MyChart, go to [email or web address].    Your next appointment:   6 month(s)  The format for your next appointment:   In Person  Provider:   You will see one of the following Advanced Practice Providers on your designated Care Team:    Richardson Dopp, PA-C  [place]in [place], [place]     Other Instructions  Mediterranean Diet A Mediterranean diet refers to food and lifestyle choices that are based on the traditions of countries located on the [employer]. This way of eating has been shown to help prevent certain conditions and improve outcomes for people who have chronic diseases, like kidney disease and heart disease. What are tips for following this plan? Lifestyle  Cook and eat meals together with your family, when possible.  Drink enough fluid to keep your urine clear or pale yellow.  Be physically active every day. This includes: ? Aerobic exercise like running or swimming. ? Leisure activities like gardening, walking, or housework.  Get 7-8 hours of sleep each night.  If recommended by your health care provider, drink red wine in moderation. This means 1 glass a day for nonpregnant women and 2 glasses a day for men. A glass of wine equals 5 oz (150 mL). Reading food  labels  Check the serving size of packaged foods. For foods such as rice and pasta, the serving size refers to the amount of cooked product, not dry.  Check the total fat in packaged foods. Avoid foods that have saturated fat or trans fats.  Check the ingredients list for added sugars, such as corn syrup.   Shopping  At the grocery store, buy most of your food from the areas near the walls of the store. This includes: ? Fresh fruits and vegetables (produce). ? Grains, beans, nuts, and seeds. Some of these may be available in unpackaged forms or large amounts (in bulk). ? Fresh seafood. ? Poultry and eggs. ? Low-fat dairy products.  Buy whole ingredients instead of prepackaged foods.  Buy fresh fruits and vegetables in-season from local farmers markets.  Buy frozen fruits and vegetables in resealable bags.  If you do not have access to quality fresh seafood, buy precooked frozen shrimp or canned fish, such as tuna, salmon, or sardines.  Buy small amounts of raw or cooked vegetables, salads, or olives from the deli or salad bar at your store.  Stock your pantry so you always have certain foods on hand, such as olive oil, canned tuna, canned tomatoes, rice, pasta, and beans. Cooking  Cook foods with extra-virgin olive oil instead of using butter or other vegetable oils.  Have meat as a side dish, and have vegetables or grains as your main dish. This means having meat in small portions or adding small amounts of meat to foods like pasta or stew.  Use beans or vegetables instead of meat in common dishes like chili or lasagna.  Experiment with different cooking methods. Try roasting or broiling vegetables instead of steaming or sauteing them.  Add frozen vegetables to soups, stews,  pasta, or rice.  Add nuts or seeds for added healthy fat at each meal. You can add these to yogurt, salads, or vegetable dishes.  Marinate fish or vegetables using olive oil, lemon juice, garlic, and  fresh herbs. Meal planning  Plan to eat 1 vegetarian meal one day each week. Try to work up to 2 vegetarian meals, if possible.  Eat seafood 2 or more times a week.  Have healthy snacks readily available, such as: ? Vegetable sticks with hummus. ? [place] yogurt. ? Fruit and nut trail mix.  Eat balanced meals throughout the week. This includes: ? Fruit: 2-3 servings a day ? Vegetables: 4-5 servings a day ? Low-fat dairy: 2 servings a day ? Fish, poultry, or lean meat: 1 serving a day ? Beans and legumes: 2 or more servings a week ? Nuts and seeds: 1-2 servings a day ? Whole grains: 6-8 servings a day ? Extra-virgin olive oil: 3-4 servings a day  Limit red meat and sweets to only a few servings a month   What are my food choices?  Mediterranean diet ? Recommended  Grains: Whole-grain pasta. Brown rice. Bulgar wheat. Polenta. Couscous. Whole-wheat bread. [place].  Vegetables: Artichokes. Beets. Broccoli. Cabbage. Carrots. Eggplant. Green beans. Chard. Kale. Spinach. Onions. Leeks. Peas. Squash. Tomatoes. Peppers. Radishes.  Fruits: Apples. Apricots. Avocado. Berries. Bananas. Cherries. Dates. Figs. Grapes. Lemons. Melon. Oranges. Peaches. Plums. Pomegranate.  Meats and other protein foods: Beans. Almonds. Sunflower seeds. Pine nuts. Peanuts. [place]. Salmon. Scallops. Shrimp. [place]. Tilapia. Clams. Oysters. Eggs.  Dairy: Low-fat milk. Cheese. Greek yogurt.  Beverages: Water. Red wine. Herbal tea.  Fats and oils: Extra virgin olive oil. Avocado oil. Grape seed oil.  Sweets and desserts: [place] yogurt with honey. Baked apples. Poached pears. Trail mix.  Seasoning and other foods: Basil. Cilantro. Coriander. Cumin. Mint. Parsley. Sage. Rosemary. Tarragon. Garlic. Oregano. Thyme. Pepper. Balsalmic vinegar. Tahini. Hummus. Tomato sauce. Olives. Mushrooms. ? Limit these  Grains: Prepackaged pasta or rice dishes. Prepackaged cereal with added sugar.  Vegetables: Deep fried  potatoes (french fries).  Fruits: Fruit canned in syrup.  Meats and other protein foods: Beef. Pork. Lamb. Poultry with skin. Hot dogs. Berniece Salines.  Dairy: Ice cream. Sour cream. Whole milk.  Beverages: Juice. Sugar-sweetened soft drinks. Beer. Liquor and spirits.  Fats and oils: Butter. Canola oil. Vegetable oil. Beef fat (tallow). Lard.  Sweets and desserts: Cookies. Cakes. Pies. Candy.  Seasoning and other foods: Mayonnaise. Premade sauces and marinades. The items listed may not be a complete list. Talk with your dietitian about what dietary choices are right for you. Summary  The Mediterranean diet includes both food and lifestyle choices.  Eat a variety of fresh fruits and vegetables, beans, nuts, seeds, and whole grains.  Limit the amount of red meat and sweets that you eat.  Talk with your health care provider about whether it is safe for you to drink red wine in moderation. This means 1 glass a day for nonpregnant women and 2 glasses a day for men. A glass of wine equals 5 oz (150 mL). This information is not intended to replace advice given to you by your health care provider. Make sure you discuss any questions you have with your health care provider. Document Revised: 02/09/2016 Document Reviewed: 02/02/2016 Elsevier Patient Education  2020 [employer].      Signed, [name], MD  09/15/2020 2:42 PM    [facility]

## 2020-09-15 ENCOUNTER — Ambulatory Visit: Payer: BC Managed Care – PPO | Admitting: Cardiology

## 2020-09-15 ENCOUNTER — Other Ambulatory Visit: Payer: Self-pay

## 2020-09-15 ENCOUNTER — Encounter: Payer: Self-pay | Admitting: Cardiology

## 2020-09-15 VITALS — BP 130/90 | HR 84 | Ht 72.0 in | Wt 272.0 lb

## 2020-09-15 DIAGNOSIS — I480 Paroxysmal atrial fibrillation: Secondary | ICD-10-CM | POA: Diagnosis not present

## 2020-09-15 DIAGNOSIS — Z9889 Other specified postprocedural states: Secondary | ICD-10-CM | POA: Diagnosis not present

## 2020-09-15 DIAGNOSIS — E669 Obesity, unspecified: Secondary | ICD-10-CM

## 2020-09-15 DIAGNOSIS — I10 Essential (primary) hypertension: Secondary | ICD-10-CM | POA: Diagnosis not present

## 2020-09-15 DIAGNOSIS — I2510 Atherosclerotic heart disease of native coronary artery without angina pectoris: Secondary | ICD-10-CM

## 2020-09-15 DIAGNOSIS — E785 Hyperlipidemia, unspecified: Secondary | ICD-10-CM

## 2020-09-15 DIAGNOSIS — Z9289 Personal history of other medical treatment: Secondary | ICD-10-CM

## 2020-09-15 DIAGNOSIS — R911 Solitary pulmonary nodule: Secondary | ICD-10-CM

## 2020-09-15 NOTE — Patient Instructions (Addendum)
Medication Instructions:  Stop Aspirin Your physician recommends that you continue on your current medications as directed. Please refer to the Current Medication list given to you today. *If you need a refill on your cardiac medications before your next appointment, please call your pharmacy*   Lab Work: Lipids in 3 months If you have labs (blood work) drawn today and your tests are completely normal, you will receive your results only by: Marland Kitchen MyChart Message (if you have MyChart) OR . A paper copy in the mail If you have any lab test that is abnormal or we need to change your treatment, we will call you to review the results.   Testing/Procedures: Patient can hold Apixaban for procedure per Dr. Gwyndolyn Kaufman and can restart after procedure according to surgeon  Provider suggested a Chest CT for Pulmonary Nodule  Follow-Up:  At [facility], you and your health needs are our priority.  As part of our continuing mission to provide you with exceptional heart care, we have created designated Provider Care Teams.  These Care Teams include your primary Cardiologist (physician) and Advanced Practice Providers (APPs -  Physician Assistants and Nurse Practitioners) who all work together to provide you with the care you need, when you need it.  We recommend signing up for the patient portal called "MyChart".  Sign up information is provided on this After Visit Summary.  MyChart is used to connect with patients for Virtual Visits (Telemedicine).  Patients are able to view lab/test results, encounter notes, upcoming appointments, etc.  Non-urgent messages can be sent to your provider as well.   To learn more about what you can do with MyChart, go to [email or web address].    Your next appointment:   6 month(s)  The format for your next appointment:   In Person  Provider:   You will see one of the following Advanced Practice Providers on your designated Care Team:    Richardson Dopp,  PA-C  Vin Tatum, [place]     Other Instructions  Mediterranean Diet A Mediterranean diet refers to food and lifestyle choices that are based on the traditions of countries located on the [employer]. This way of eating has been shown to help prevent certain conditions and improve outcomes for people who have chronic diseases, like kidney disease and heart disease. What are tips for following this plan? Lifestyle  Cook and eat meals together with your family, when possible.  Drink enough fluid to keep your urine clear or pale yellow.  Be physically active every day. This includes: ? Aerobic exercise like running or swimming. ? Leisure activities like gardening, walking, or housework.  Get 7-8 hours of sleep each night.  If recommended by your health care provider, drink red wine in moderation. This means 1 glass a day for nonpregnant women and 2 glasses a day for men. A glass of wine equals 5 oz (150 mL). Reading food labels  Check the serving size of packaged foods. For foods such as rice and pasta, the serving size refers to the amount of cooked product, not dry.  Check the total fat in packaged foods. Avoid foods that have saturated fat or trans fats.  Check the ingredients list for added sugars, such as corn syrup.   Shopping  At the grocery store, buy most of your food from the areas near the walls of the store. This includes: ? Fresh fruits and vegetables (produce). ? Grains, beans, nuts, and seeds. Some of these may be available in unpackaged  forms or large amounts (in bulk). ? Fresh seafood. ? Poultry and eggs. ? Low-fat dairy products.  Buy whole ingredients instead of prepackaged foods.  Buy fresh fruits and vegetables in-season from local farmers markets.  Buy frozen fruits and vegetables in resealable bags.  If you do not have access to quality fresh seafood, buy precooked frozen shrimp or canned fish, such as tuna, salmon, or sardines.  Buy small  amounts of raw or cooked vegetables, salads, or olives from the deli or salad bar at your store.  Stock your pantry so you always have certain foods on hand, such as olive oil, canned tuna, canned tomatoes, rice, pasta, and beans. Cooking  Cook foods with extra-virgin olive oil instead of using butter or other vegetable oils.  Have meat as a side dish, and have vegetables or grains as your main dish. This means having meat in small portions or adding small amounts of meat to foods like pasta or stew.  Use beans or vegetables instead of meat in common dishes like chili or lasagna.  Experiment with different cooking methods. Try roasting or broiling vegetables instead of steaming or sauteing them.  Add frozen vegetables to soups, stews, pasta, or rice.  Add nuts or seeds for added healthy fat at each meal. You can add these to yogurt, salads, or vegetable dishes.  Marinate fish or vegetables using olive oil, lemon juice, garlic, and fresh herbs. Meal planning  Plan to eat 1 vegetarian meal one day each week. Try to work up to 2 vegetarian meals, if possible.  Eat seafood 2 or more times a week.  Have healthy snacks readily available, such as: ? Vegetable sticks with hummus. ? [place] yogurt. ? Fruit and nut trail mix.  Eat balanced meals throughout the week. This includes: ? Fruit: 2-3 servings a day ? Vegetables: 4-5 servings a day ? Low-fat dairy: 2 servings a day ? Fish, poultry, or lean meat: 1 serving a day ? Beans and legumes: 2 or more servings a week ? Nuts and seeds: 1-2 servings a day ? Whole grains: 6-8 servings a day ? Extra-virgin olive oil: 3-4 servings a day  Limit red meat and sweets to only a few servings a month   What are my food choices?  Mediterranean diet ? Recommended  Grains: Whole-grain pasta. Brown rice. Bulgar wheat. Polenta. Couscous. Whole-wheat bread. [place].  Vegetables: Artichokes. Beets. Broccoli. Cabbage. Carrots. Eggplant. Green  beans. Chard. Kale. Spinach. Onions. Leeks. Peas. Squash. Tomatoes. Peppers. Radishes.  Fruits: Apples. Apricots. Avocado. Berries. Bananas. Cherries. Dates. Figs. Grapes. Lemons. Melon. Oranges. Peaches. Plums. Pomegranate.  Meats and other protein foods: Beans. Almonds. Sunflower seeds. Pine nuts. Peanuts. [place]. Salmon. Scallops. Shrimp. [place]. Tilapia. Clams. Oysters. Eggs.  Dairy: Low-fat milk. Cheese. Greek yogurt.  Beverages: Water. Red wine. Herbal tea.  Fats and oils: Extra virgin olive oil. Avocado oil. Grape seed oil.  Sweets and desserts: [place] yogurt with honey. Baked apples. Poached pears. Trail mix.  Seasoning and other foods: Basil. Cilantro. Coriander. Cumin. Mint. Parsley. Sage. Rosemary. Tarragon. Garlic. Oregano. Thyme. Pepper. Balsalmic vinegar. Tahini. Hummus. Tomato sauce. Olives. Mushrooms. ? Limit these  Grains: Prepackaged pasta or rice dishes. Prepackaged cereal with added sugar.  Vegetables: Deep fried potatoes (french fries).  Fruits: Fruit canned in syrup.  Meats and other protein foods: Beef. Pork. Lamb. Poultry with skin. Hot dogs. Berniece Salines.  Dairy: Ice cream. Sour cream. Whole milk.  Beverages: Juice. Sugar-sweetened soft drinks. Beer. Liquor and spirits.  Fats and oils:  Butter. Canola oil. Vegetable oil. Beef fat (tallow). Lard.  Sweets and desserts: Cookies. Cakes. Pies. Candy.  Seasoning and other foods: Mayonnaise. Premade sauces and marinades. The items listed may not be a complete list. Talk with your dietitian about what dietary choices are right for you. Summary  The Mediterranean diet includes both food and lifestyle choices.  Eat a variety of fresh fruits and vegetables, beans, nuts, seeds, and whole grains.  Limit the amount of red meat and sweets that you eat.  Talk with your health care provider about whether it is safe for you to drink red wine in moderation. This means 1 glass a day for nonpregnant women and 2 glasses a day for men.  A glass of wine equals 5 oz (150 mL). This information is not intended to replace advice given to you by your health care provider. Make sure you discuss any questions you have with your health care provider. Document Revised: 02/09/2016 Document Reviewed: 02/02/2016 Elsevier Patient Education  [employer].

## 2020-09-16 ENCOUNTER — Telehealth: Payer: Self-pay | Admitting: *Deleted

## 2020-09-16 NOTE — Telephone Encounter (Signed)
   Primary Cardiologist: [name], MD  Chart reviewed as part of pre-operative protocol coverage. Given past medical history and time since last visit, based on ACC/AHA guidelines, [place] T[place]ik would be at acceptable risk for the planned procedure without further cardiovascular testing.  Patient was just seen yesterday by MD and cleared to proceed with procedure.  The patient was advised that if he develops new symptoms prior to surgery to contact our office to arrange for a follow-up visit, and he verbalized understanding.  I will route this recommendation to the requesting party via Epic fax function and remove from pre-op pool.  Please call with questions.  His aspirin has been discontinued yesterday.  We will check with clinical pharmacist how long to hold Eliquis.  Per Dr. Johney Frame, okay to hold Eliquis prior to prostate biopsy and resume once cleared by surgery team, no bridging needed.  [place], [place] 09/16/2020, 10:56 AM

## 2020-09-16 NOTE — Telephone Encounter (Signed)
Patient with diagnosis of afib on Eliquis for anticoagulation.    Procedure: MRI prostate biopsy Date of procedure: 09/28/20  CHA2DS2-VASc Score = 2  This indicates a 2.2% annual risk of stroke. The patient's score is based upon: CHF History: No HTN History: Yes Diabetes History: No Stroke History: No Vascular Disease History: Yes Age Score: 0 Gender Score: 0  CrCl > 185mL/min using adjusted body weight Platelet count 264K  Per office protocol, patient can hold Eliquis for 2-3 days prior to procedure.

## 2020-09-16 NOTE — Telephone Encounter (Signed)
   [facility] Pre-operative Risk Assessment    HEARTCARE STAFF: - Please ensure there is not already an duplicate clearance open for this procedure. - Under Visit Info/Reason for Call, type in Other and utilize the format Clearance MM/DD/YY or Clearance TBD. Do not use dashes or single digits. - If request is for dental extraction, please clarify the # of teeth to be extracted.  Request for surgical clearance:  1. What type of surgery is being performed?  MRI PROSTATE BX   2. When is this surgery scheduled?  09/28/20   3. What type of clearance is required (medical clearance vs. Pharmacy clearance to hold med vs. Both)?  BOTH  4. Are there any medications that need to be held prior to surgery and how long? ASPIRIN & ELIQUIS   5. Practice name and name of physician performing surgery?  [facility] / DR. Abner Greenspan    6. What is the office phone number?  [phone]   7.   What is the office fax number?  [phone]  8.   Anesthesia type (None, local, MAC, general) ?      Jeanann Lewandowsky 09/16/2020, 10:46 AM  _________________________________________________________________   (provider comments below)

## 2020-09-19 NOTE — Telephone Encounter (Signed)
   Primary Cardiologist: [name], MD  Chart reviewed as part of pre-operative protocol coverage. Given past medical history and time since last visit, based on ACC/AHA guidelines, James Weeks would be at acceptable risk for the planned procedure without further cardiovascular testing.   The patients ASA was discontinued 09/15/20. Per Dr. Johney Frame, okay to hold Eliquis prior to prostate biopsy and resume once cleared by surgery team, no bridging needed.   Per pharmacy team: Patient with diagnosis of afib on Eliquis for anticoagulation.    Procedure: MRI prostate biopsy Date of procedure: 09/28/20  CHA2DS2-VASc Score = 2  This indicates a 2.2% annual risk of stroke. The patient's score is based upon: CHF History: No HTN History: Yes Diabetes History: No Stroke History: No Vascular Disease History: Yes Age Score: 0 Gender Score: 0  CrCl > 178mL/min using adjusted body weight Platelet count 264K  Per office protocol, patient can hold Eliquis for 2-3 days prior to procedure.  The patient was advised that if he develops new symptoms prior to surgery to contact our office to arrange for a follow-up visit, and he verbalized understanding.  I will route this recommendation to the requesting party via Epic fax function and remove from pre-op pool.  Please call with questions.  Kathyrn Drown, NP 09/19/2020, 10:35 AM

## 2020-09-20 NOTE — Telephone Encounter (Signed)
Fax resent 09/20/20

## 2020-09-20 NOTE — Telephone Encounter (Signed)
[facility] called and states they have not gotten any fax regarding this paitent's clearance. Please re-fax clearance to [phone] and [phone]

## 2020-09-28 DIAGNOSIS — C61 Malignant neoplasm of prostate: Secondary | ICD-10-CM | POA: Diagnosis not present

## 2020-09-29 ENCOUNTER — Ambulatory Visit (INDEPENDENT_AMBULATORY_CARE_PROVIDER_SITE_OTHER)
Admission: RE | Admit: 2020-09-29 | Discharge: 2020-09-29 | Disposition: A | Discharge status: Home / Self Care | Payer: BC Managed Care – PPO | Source: Ambulatory Visit | Attending: Cardiology | Admitting: Cardiology

## 2020-09-29 ENCOUNTER — Other Ambulatory Visit: Payer: Self-pay

## 2020-09-29 DIAGNOSIS — J432 Centrilobular emphysema: Secondary | ICD-10-CM | POA: Diagnosis not present

## 2020-09-29 DIAGNOSIS — J438 Other emphysema: Secondary | ICD-10-CM | POA: Diagnosis not present

## 2020-09-29 DIAGNOSIS — J8410 Pulmonary fibrosis, unspecified: Secondary | ICD-10-CM | POA: Diagnosis not present

## 2020-09-29 DIAGNOSIS — I2510 Atherosclerotic heart disease of native coronary artery without angina pectoris: Secondary | ICD-10-CM | POA: Diagnosis not present

## 2020-09-29 DIAGNOSIS — R911 Solitary pulmonary nodule: Secondary | ICD-10-CM | POA: Diagnosis not present

## 2020-10-05 ENCOUNTER — Other Ambulatory Visit: Payer: Self-pay

## 2020-10-05 MED ORDER — ELIQUIS 5 MG PO TABS: 5.0000 mg | ORAL_TABLET | Freq: Two times a day (BID) | ORAL | 6 refills | Status: DC

## 2020-10-05 NOTE — Telephone Encounter (Signed)
Pt last saw [name] 09/15/20, last labs 05/16/20 Creat 1.04, age 61, weight 123.4kg, based on specified criteria pt is on appropriate dosage of Eliquis 5mg  BID.  Will refill rx.

## 2020-10-06 DIAGNOSIS — C61 Malignant neoplasm of prostate: Secondary | ICD-10-CM | POA: Diagnosis not present

## 2020-10-19 ENCOUNTER — Encounter: Payer: Self-pay | Admitting: Radiation Oncology

## 2020-10-19 DIAGNOSIS — I10 Essential (primary) hypertension: Secondary | ICD-10-CM | POA: Insufficient documentation

## 2020-10-19 NOTE — Progress Notes (Signed)
GU Location of Tumor / Histology: prostatic adenocarcinoma  If Prostate Cancer, Gleason Score is (3 + 4) and PSA is (4.64). Prostate volume: 27g  James Weeks was diagnosed with prostate cancer in April 2020 but opted for active surveillance.    Biopsies of prostate (if applicable) revealed:   Past/Anticipated interventions by urology, if any: prostate biopsy, active surveillance, prostate biopsy, referral to Dr. Tammi Klippel for consideration of radiation therapy  Past/Anticipated interventions by medical oncology, if any: no  Weight changes, if any: no  Bowel/Bladder complaints, if any: IPSS 8. SHIM 24. Denies dysuria, hematuria, urinary leakage or incontinence. Reports diarrhea on occasion related to allergy/pork.   Nausea/Vomiting, if any: denies  Pain issues, if any:  Denies new pain.  SAFETY ISSUES:  Prior radiation? denies  Pacemaker/ICD? denies  Possible current pregnancy? no, male patient  Is the patient on methotrexate? denies  Current Complaints / other details:  60 year old male. Married with 2 sons. Resides in [place].

## 2020-10-20 ENCOUNTER — Other Ambulatory Visit: Payer: Self-pay

## 2020-10-20 ENCOUNTER — Ambulatory Visit
Admission: RE | Admit: 2020-10-20 | Discharge: 2020-10-20 | Disposition: A | Discharge status: Home / Self Care | Payer: BC Managed Care – PPO | Source: Ambulatory Visit | Attending: Radiation Oncology | Admitting: Radiation Oncology

## 2020-10-20 ENCOUNTER — Encounter: Payer: Self-pay | Admitting: Radiation Oncology

## 2020-10-20 VITALS — BP 125/83 | HR 92 | Temp 97.9°F | Resp 20 | Ht 73.0 in | Wt 275.6 lb

## 2020-10-20 DIAGNOSIS — R042 Hemoptysis: Secondary | ICD-10-CM | POA: Diagnosis not present

## 2020-10-20 DIAGNOSIS — R972 Elevated prostate specific antigen [PSA]: Secondary | ICD-10-CM | POA: Diagnosis not present

## 2020-10-20 DIAGNOSIS — I208 Other forms of angina pectoris: Secondary | ICD-10-CM | POA: Insufficient documentation

## 2020-10-20 DIAGNOSIS — R739 Hyperglycemia, unspecified: Secondary | ICD-10-CM | POA: Insufficient documentation

## 2020-10-20 DIAGNOSIS — I2510 Atherosclerotic heart disease of native coronary artery without angina pectoris: Secondary | ICD-10-CM | POA: Diagnosis not present

## 2020-10-20 DIAGNOSIS — E785 Hyperlipidemia, unspecified: Secondary | ICD-10-CM | POA: Insufficient documentation

## 2020-10-20 DIAGNOSIS — E669 Obesity, unspecified: Secondary | ICD-10-CM | POA: Diagnosis not present

## 2020-10-20 DIAGNOSIS — Z79899 Other long term (current) drug therapy: Secondary | ICD-10-CM | POA: Insufficient documentation

## 2020-10-20 DIAGNOSIS — I700 Atherosclerosis of aorta: Secondary | ICD-10-CM | POA: Insufficient documentation

## 2020-10-20 DIAGNOSIS — C61 Malignant neoplasm of prostate: Secondary | ICD-10-CM | POA: Diagnosis not present

## 2020-10-20 DIAGNOSIS — I10 Essential (primary) hypertension: Secondary | ICD-10-CM | POA: Insufficient documentation

## 2020-10-20 HISTORY — DX: Malignant neoplasm of prostate: C61

## 2020-10-20 NOTE — Progress Notes (Addendum)
Radiation Oncology         [phone]) [phone] ________________________________  Initial outpatient Consultation  Name: James Weeks MRN: [id number]  Date: 10/20/2020  DOB: 1960-11-26  IO:NGEX, [name], MD  [name], MD   REFERRING PHYSICIAN: [name], MD  DIAGNOSIS: 60 y.o. gentleman with stage T1c adenocarcinoma of the prostate with a Gleason's score of 3+4 and a PSA of 5.18    ICD-10-CM   1. Malignant neoplasm of prostate ([place])  [facility] referral to [place] is a 60 y.o. gentleman.  He was initially noted to have an elevated PSA of 4.8 and underwent transrectal ultrasound with 12 biopsies of the prostate on 10/07/18.  The prostate volume measured 29 cc.  Out of 12 core biopsies, 3 were positive.  The maximum Gleason score was 3+3.  He opted for active surveillance and had confirmatory transrectal ultrasound with 12 biopsies of the prostate on 06/16/19.  The prostate volume measured 27 cc.  Out of 12 core biopsies, 4 were positive.  The maximum Gleason score was 3+4, and this was seen in one core.  He elected to continue in active surveillance.  Most recently, his PSA was 5.18 on 07/21/2020.  He had prostate MRI 08/16/20 which showed a 1.5 cm PIRADS category 5 lesion in the RIGHT mid gland peripheral zone. There was no evidence of extracapsular extension, seminal vesicle invasion or metastatic disease.    The patient proceeded to MRI fusion transrectal ultrasound with 16 biopsies of the prostate on 09/28/20.  The prostate volume measured 27 cc.  Out of the 12 standard core biopsies, 6 were positive.  The maximum Gleason score was 3+4, and this was seen in the right base, right mid, right mid lateral, right apex and right apex lateral.  Additionally, Gleason 3+4 was found in 4 out of the 4 samples taken from the region of interest MRI lesion.  He had a single core of Gleason 3+3 in the left base lateral.    The patient  reviewed the biopsy results with his urologist and he has kindly been referred today for discussion of potential radiation treatment options.  PREVIOUS RADIATION THERAPY: No  PAST MEDICAL HISTORY:  has a past medical history of Allergic rhinitis due to pollen, Angina of effort ([place]), Anxiety, Body mass index 34.0-34.9, adult, Elevated blood sugar, Hemoptysis, Hyperlipidemia, Hypertension, Nodule of skin of breast, Obese, Prostate cancer ([place]), and PSA elevation.    PAST SURGICAL HISTORY: Past Surgical History:  Procedure Laterality Date  . CARDIOVERSION N/A 10/30/2019   Procedure: CARDIOVERSION;  Surgeon: [name], MD;  Location: [facility];  Service: Cardiovascular;  Laterality: N/A;  . NO PAST SURGERIES    . PROSTATE BIOPSY      FAMILY HISTORY: family history includes Asthma in his brother and father; Breast cancer in his maternal aunt and mother; CVA in an other family member; Cancer in his maternal grandmother and paternal grandmother; Diabetes in his mother; Heart attack in an other family member; Heart disease in his father and another family member; Hypertension in his mother and another family member; Lung cancer in his maternal grandfather; Other (age of onset: 74) in his father; Prostate cancer in his paternal grandfather and paternal uncle.  SOCIAL HISTORY:  reports that he has been smoking cigars. He has been smoking about 0.50 packs per day. He has never used smokeless tobacco. He reports current alcohol use. He reports that he does  not use drugs.  ALLERGIES: Alpha-gal  MEDICATIONS:  Current Outpatient Medications  Medication Sig Dispense Refill  . amLODipine (NORVASC) 5 MG tablet TAKE 1 TABLET BY MOUTH EVERYDAY AT BEDTIME 90 tablet 1  . apixaban (ELIQUIS) 5 MG TABS tablet Take 1 tablet (5 mg total) by mouth 2 (two) times daily. 60 tablet 6  . diazepam (VALIUM) 10 MG tablet Take 5 mg by mouth daily as needed for anxiety.     . fluticasone (FLONASE) 50 MCG/ACT nasal spray  Place 50 mcg into the nose daily.    Marland Kitchen loratadine (CLARITIN) 10 MG tablet Take 20 mg by mouth daily.    . metoprolol succinate (TOPROL-XL) 100 MG 24 hr tablet Take 100 mg by mouth at bedtime.     . tamsulosin (FLOMAX) 0.4 MG CAPS capsule Take 0.4 mg by mouth at bedtime.     Marland Kitchen EPINEPHrine 0.3 mg/0.3 mL IJ SOAJ injection Inject 1 mg into the muscle as needed for anaphylaxis. (Patient not taking: Reported on 10/20/2020) 2 each 1   No current facility-administered medications for this encounter.    REVIEW OF SYSTEMS:  On review of systems, the patient reports that he is doing well overall.  He denies any chest pain, shortness of breath, cough, fevers, chills, night sweats, unintended weight changes. He denies any bowel or bladder disturbances, and denies abdominal pain, nausea or vomiting. He denies any new musculoskeletal or joint aches or pains, new skin lesions or concerns.  The patient completed an IPSS and SHIM questionnaire.  His IPSS score was 8 indicating moderate urinary outflow obstructive symptoms.  He indicated that his erectile function SHIM was 24    PHYSICAL EXAM: This patient is in no acute distress.  He is alert and oriented.   height is 6\' 1"  (1.854 m) and weight is 275 lb 9.6 oz (125 kg). His temperature is 97.9 F (36.6 C). His blood pressure is 125/83 and his pulse is 92. His respiration is 20 and oxygen saturation is 98%.  He exhibits no respiratory distress or labored breathing.  He appears neurologically intact.  His mood is pleasant.  His affect is appropriate.  Please note the digital rectal exam findings described above.  KPS = 100  100 - Normal; no complaints; no evidence of disease. 90   - Able to carry on normal activity; minor signs or symptoms of disease. 80   - Normal activity with effort; some signs or symptoms of disease. 89   - Cares for self; unable to carry on normal activity or to do active work. 60   - Requires occasional assistance, but is able to care for  most of his personal needs. 50   - Requires considerable assistance and frequent medical care. 63   - Disabled; requires special care and assistance. 1   - Severely disabled; hospital admission is indicated although death not imminent. 65   - Very sick; hospital admission necessary; active supportive treatment necessary. 10   - Moribund; fatal processes progressing rapidly. 0     - Dead  Karnofsky DA, Abelmann Milam, Craver LS and [facility]chenal [facility] [phone]) The use of the nitrogen mustards in the palliative treatment of carcinoma: with particular reference to bronchogenic carcinoma Cancer 1 634-56   LABORATORY DATA:  Lab Results  Component Value Date   WBC 7.5 10/28/2019   HGB 16.2 10/28/2019   HCT 47.2 10/28/2019   MCV 87 10/28/2019   PLT 241 10/28/2019   Lab Results  Component Value Date  NA 138 10/28/2019   K 4.0 10/28/2019   CL 103 10/28/2019   CO2 25 10/28/2019   No results found for: ALT, AST, GGT, ALKPHOS, BILITOT   RADIOGRAPHY: CT CHEST WO CONTRAST  Result Date: 09/30/2020 CLINICAL DATA:  Follow up pulmonary nodules. No given history of malignancy. EXAM: CT CHEST WITHOUT CONTRAST TECHNIQUE: Multidetector CT imaging of the chest was performed following the standard protocol without IV contrast. COMPARISON:  CT chest 09/07/2019.  Cardiac CT 09/05/2018. FINDINGS: Cardiovascular: Mild aortic and coronary artery atherosclerosis. Probable calcifications of the aortic valve. The heart size is normal. There is no pericardial effusion. Mediastinum/Nodes: There are no enlarged mediastinal, hilar or axillary lymph nodes.Small calcified and noncalcified mediastinal and right hilar lymph nodes are again noted. The thyroid gland, trachea and esophagus demonstrate no significant findings. Lungs/Pleura: There is no pleural effusion. Scattered small noncalcified pulmonary nodules bilaterally are unchanged, largest in the left upper lobe measuring 5 mm on image 74/3. There are calcified right lung  granulomas. No new or enlarging pulmonary nodules. Underlying mild to moderate upper lobe predominant centrilobular emphysema. Upper abdomen: The visualized upper abdomen appears stable without significant findings. Musculoskeletal/Chest wall: There is no chest wall mass or suspicious osseous finding. Mild thoracic spine degenerative changes. IMPRESSION: 1. Stable small pulmonary nodules bilaterally, consistent with benign findings. Per consensus guidelines, these require no additional follow-up. This recommendation follows the consensus statement: Guidelines for Management of Small Pulmonary Nodules Detected on CT Images: From the Fleischner Society 2017; Radiology 2017; 284:228-243. 2. Sequela of prior granulomatous disease. 3. Aortic Atherosclerosis (ICD10-I70.0) and Emphysema (ICD10-J43.9). Electronically Signed   By: Richardean Sale M.D.   On: 09/30/2020 14:25      IMPRESSION/PLAN: 60 y/o gentleman with a favorable inttermediate risk, stage T1c adenocarcinoma of the prostate with a PSA of 5.18 and a Gleason score of 3+4.    I discussed the patient's workup and outlined the nature of prostate cancer in this setting. The patient's T stage, Gleason's score, and PSA put him into the favorable intermediate risk group. Accordingly, he is eligible for a variety of potential treatment options including brachytherapy, 8 weeks of external radiation. We discussed the available radiation techniques, and focused on the details and logistics and delivery. We discussed and outlined the risks, benefits, short and long-term effects associated with radiotherapy and compared and contrasted these with prostatectomy. We discussed the role of SpaceOAR in reducing the rectal toxicity associated with radiotherapy.  He was encouraged to ask questions that were answered to his stated satisfaction.  At the end of the conversation, the patient is interested in moving forward with brachytherapy. We will contact [facility] to  make arrangements.  I will share our discussion with Dr. Abner Greenspan and move forward with treatment planning accordingly.  I enjoyed meeting him today and look forward to continuing to participate in his care.   I spent 60 minutes in this encounter   ------------------------------------------------  Sheral Apley. Tammi Klippel, M.D.

## 2020-10-24 ENCOUNTER — Encounter: Payer: Self-pay | Admitting: Licensed Clinical Social Worker

## 2020-10-24 NOTE — Progress Notes (Signed)
[facility] Psychosocial Distress Screening Clinical Social Work  Clinical Social Work was referred by distress screening protocol.  The patient scored a 7 on the Psychosocial Distress Thermometer which indicates moderate distress. Clinical Social Worker contacted patient by phone to assess for distress and other psychosocial needs.   CSW and patient discussed the importance of support during treatment. Pt reports having good support in his life. He is mainly working from home and work has been supportive so far as well. No resource concerns at this time.   CSW informed patient of the support team and support services at [facility].  CSW provided contact information and encouraged patient to call with any questions or concerns.  ONCBCN DISTRESS SCREENING 10/20/2020  Screening Type Initial Screening  Distress experienced in past week (1-10) 7  Practical problem type Work/school  Family Problem type Other (comment)  Emotional problem type Adjusting to illness  Information Concerns Type Lack of info about treatment  Physical Problem type Pain;Breathing;Swollen arms/legs  Physician notified of physical symptoms Yes  Referral to clinical psychology No  Referral to clinical social work Yes  Referral to dietition No  Referral to financial advocate No  Referral to support programs Yes  Referral to palliative care No    Clinical Social Worker follow up needed: No.  If yes, follow up plan:  James Weeks E Meghanne Pletz, LCSW

## 2020-10-25 ENCOUNTER — Telehealth: Payer: Self-pay | Admitting: *Deleted

## 2020-10-25 NOTE — Telephone Encounter (Signed)
CALLED PATIENT TO ASK QUESTIONS, LVM FOR A RETURN CALL 

## 2020-10-27 ENCOUNTER — Telehealth: Payer: Self-pay | Admitting: *Deleted

## 2020-10-27 NOTE — Telephone Encounter (Signed)
CALLED PATIENT TO ASK QUESTIONS, SPOKE WITH PATIENT 

## 2020-10-31 ENCOUNTER — Telehealth: Payer: Self-pay | Admitting: *Deleted

## 2020-10-31 NOTE — Telephone Encounter (Signed)
CALLED PATIENT TO UPDATE, LVM

## 2020-10-31 NOTE — Addendum Note (Signed)
Encounter addended by: Tyler Pita, MD on: 10/31/2020 8:38 AM  Actions taken: Clinical Note Signed

## 2020-11-03 ENCOUNTER — Other Ambulatory Visit: Payer: Self-pay | Admitting: Urology

## 2020-11-03 DIAGNOSIS — C61 Malignant neoplasm of prostate: Secondary | ICD-10-CM

## 2020-11-04 ENCOUNTER — Telehealth: Payer: Self-pay | Admitting: *Deleted

## 2020-11-04 NOTE — Telephone Encounter (Signed)
CALLED PATIENT TO INFORM OF PRE-SEED APPTS. FOR 01-05-21 AND HIS IMPLANT FOR 02-06-21, SPOKE WITH PATIENT AND HE IS AWARE OF THESE APPTS.

## 2020-11-10 DIAGNOSIS — C61 Malignant neoplasm of prostate: Secondary | ICD-10-CM | POA: Diagnosis not present

## 2020-12-09 ENCOUNTER — Telehealth: Payer: Self-pay | Admitting: Cardiology

## 2020-12-09 NOTE — Telephone Encounter (Signed)
*  STAT* If patient is at the pharmacy, call can be transferred to refill team.   1. Which medications need to be refilled? (please list name of each medication and dose if known) apixaban (ELIQUIS) 5 MG TABS tablet  2. Which pharmacy/location (including street and city if local pharmacy) is medication to be sent to? [id number] - [place], [place] - [address]  3. Do they need a 30 day or 90 day supply? 30 day   Patient goes out of [place] Tuesday and does not have enough to last, while gone.

## 2020-12-09 NOTE — Telephone Encounter (Signed)
Eliquis rx was sent to pharmacy in April for 30 day supply with 6 refills. He doesn't need new rx, just needs to ask the pharmacy to refill. Called pt, he states he's leaving the country sooner than when the pharmacy would normally refill his Eliquis. Advised him to let the pharmacy know he's leaving the country, they can usually do a vacation override to fill the rx sooner. Also advised him to call us back if for some reason they cannot, would be able to provide him with samples if needed.

## 2020-12-11 DIAGNOSIS — Z20828 Contact with and (suspected) exposure to other viral communicable diseases: Secondary | ICD-10-CM | POA: Diagnosis not present

## 2020-12-15 ENCOUNTER — Other Ambulatory Visit: Payer: BC Managed Care – PPO

## 2021-01-04 ENCOUNTER — Telehealth: Payer: Self-pay | Admitting: *Deleted

## 2021-01-04 NOTE — Telephone Encounter (Signed)
CALLED PATIENT TO REMIND OF PRE-SEED APPTS. FOR 01-05-21, SPOKE WITH PATIENT AND HE IS AWARE OF THESE APPTS.

## 2021-01-04 NOTE — Progress Notes (Signed)
  Radiation Oncology         [phone] ________________________________  Name: James Weeks MRN: [id number]  Date: 01/05/2021  DOB: 1961/05/16  SIMULATION AND TREATMENT PLANNING NOTE PUBIC ARCH STUDY  PN:TIRW, [name], MD  [name], MD  DIAGNOSIS: 60 y.o. gentleman with stage T1c adenocarcinoma of the prostate with a Gleason's score of 3+4 and a PSA of 5.18  Oncology History  Malignant neoplasm of prostate ([place])  09/28/2020 Cancer Staging   Staging form: Prostate, AJCC 8th Edition - Clinical stage from 09/28/2020: Stage IIB (cT1c, cN0, cM0, PSA: 5.2, Grade Group: 2) - Signed by Freeman Caldron, PA-C on 10/20/2020  Histopathologic type: Adenocarcinoma, NOS  Stage prefix: Initial diagnosis  Prostate specific antigen (PSA) range: Less than 10  Gleason primary pattern: 3  Gleason secondary pattern: 4  Gleason score: 7  Histologic grading system: 5 grade system  Number of biopsy cores examined: 16  Number of biopsy cores positive: 10  Location of positive needle core biopsies: Both sides    10/20/2020 Initial Diagnosis   Malignant neoplasm of prostate ([place])        ICD-10-CM   1. Malignant neoplasm of prostate ([place])  C61       COMPLEX SIMULATION:  The patient presented today for evaluation for possible prostate seed implant. He was brought to the radiation planning suite and placed supine on the CT couch. A 3-dimensional image study set was obtained in upload to the planning computer. There, on each axial slice, I contoured the prostate gland. Then, using three-dimensional radiation planning tools I reconstructed the prostate in view of the structures from the transperineal needle pathway to assess for possible pubic arch interference. In doing so, I did not appreciate any pubic arch interference. Also, the patient's prostate volume was estimated based on the drawn structure. The volume was 27 cc.  Given the pubic arch appearance and prostate volume, patient  remains a good candidate to proceed with prostate seed implant. Today, he freely provided informed written consent to proceed.    PLAN: The patient will undergo prostate seed implant.   ________________________________  Sheral Apley. Tammi Klippel, M.D.

## 2021-01-05 ENCOUNTER — Encounter (HOSPITAL_COMMUNITY)
Admission: RE | Admit: 2021-01-05 | Discharge: 2021-01-05 | Disposition: A | Discharge status: Home / Self Care | Payer: BC Managed Care – PPO | Source: Ambulatory Visit | Attending: Urology | Admitting: Urology

## 2021-01-05 ENCOUNTER — Other Ambulatory Visit: Payer: Self-pay

## 2021-01-05 ENCOUNTER — Encounter: Payer: Self-pay | Admitting: Urology

## 2021-01-05 ENCOUNTER — Ambulatory Visit (HOSPITAL_COMMUNITY)
Admission: RE | Admit: 2021-01-05 | Discharge: 2021-01-05 | Disposition: A | Discharge status: Home / Self Care | Payer: BC Managed Care – PPO | Source: Ambulatory Visit | Attending: Urology | Admitting: Urology

## 2021-01-05 ENCOUNTER — Ambulatory Visit
Admission: RE | Admit: 2021-01-05 | Discharge: 2021-01-05 | Disposition: A | Discharge status: Home / Self Care | Payer: BC Managed Care – PPO | Source: Ambulatory Visit | Attending: Urology | Admitting: Urology

## 2021-01-05 ENCOUNTER — Other Ambulatory Visit: Payer: BC Managed Care – PPO

## 2021-01-05 ENCOUNTER — Ambulatory Visit
Admission: RE | Admit: 2021-01-05 | Discharge: 2021-01-05 | Disposition: A | Discharge status: Home / Self Care | Payer: BC Managed Care – PPO | Source: Ambulatory Visit | Attending: Radiation Oncology | Admitting: Radiation Oncology

## 2021-01-05 DIAGNOSIS — C61 Malignant neoplasm of prostate: Secondary | ICD-10-CM | POA: Insufficient documentation

## 2021-01-05 DIAGNOSIS — Z510 Encounter for antineoplastic radiation therapy: Secondary | ICD-10-CM | POA: Insufficient documentation

## 2021-01-05 DIAGNOSIS — E785 Hyperlipidemia, unspecified: Secondary | ICD-10-CM

## 2021-01-05 DIAGNOSIS — R918 Other nonspecific abnormal finding of lung field: Secondary | ICD-10-CM | POA: Diagnosis not present

## 2021-01-05 DIAGNOSIS — R911 Solitary pulmonary nodule: Secondary | ICD-10-CM | POA: Diagnosis not present

## 2021-01-05 DIAGNOSIS — Z01818 Encounter for other preprocedural examination: Secondary | ICD-10-CM | POA: Diagnosis not present

## 2021-01-05 LAB — LIPID PANEL
Chol/HDL Ratio: 6 ratio — ABNORMAL HIGH (ref 0.0–5.0)
Cholesterol, Total: 198 mg/dL (ref 100–199)
HDL: 33 mg/dL — ABNORMAL LOW (ref >39)
LDL Chol Calc (NIH): 131 mg/dL — ABNORMAL HIGH (ref 0–99)
Triglycerides: 191 mg/dL — ABNORMAL HIGH (ref 0–149)
VLDL Cholesterol Cal: 34 mg/dL (ref 5–40)

## 2021-01-05 NOTE — Progress Notes (Signed)
Patient in for pre seed appointment. Patient states he had a chest xray not that long ago about 3 months ago. Denies any pain at this time.

## 2021-01-11 ENCOUNTER — Telehealth: Payer: Self-pay | Admitting: Cardiology

## 2021-01-11 MED ORDER — ROSUVASTATIN CALCIUM 10 MG PO TABS: 10.0000 mg | ORAL_TABLET | Freq: Every day | ORAL | 1 refills | Status: DC

## 2021-01-11 NOTE — Telephone Encounter (Signed)
Pt is returning a call about results

## 2021-01-11 NOTE — Telephone Encounter (Signed)
Pt aware of lab results and recommendations per Dr. Johney Frame. Confirmed the pharmacy of choice with the pt. Pt verbalized understanding and agrees with this plan.

## 2021-01-11 NOTE — Telephone Encounter (Signed)
-----   Message from [name], MD sent at 01/06/2021  4:47 PM EDT ----- His cholesterol is still above goal with LDL of 131. If he is willing would recommend crestor 10mg  daily with goal LDL<70.

## 2021-01-31 ENCOUNTER — Telehealth: Payer: Self-pay | Admitting: *Deleted

## 2021-01-31 ENCOUNTER — Telehealth: Payer: Self-pay | Admitting: Cardiology

## 2021-01-31 NOTE — Telephone Encounter (Signed)
   [facility] Pre-operative Risk Assessment    Patient Name: James Weeks  DOB: 1960-06-28 MRN: [id number]  HEARTCARE STAFF:  - IMPORTANT!!!!!! Under Visit Info/Reason for Call, type in Other and utilize the format Clearance MM/DD/YY or Clearance TBD. Do not use dashes or single digits. - Please review there is not already an duplicate clearance open for this procedure. - If request is for dental extraction, please clarify the # of teeth to be extracted. - If the patient is currently at the dentist's office, call Pre-Op Callback Staff (MA/nurse) to input urgent request.  - If the patient is not currently in the dentist office, please route to the Pre-Op pool.  Request for surgical clearance:  What type of surgery is being performed? Brachytherapy space OAR  When is this surgery scheduled? 02/06/2021  What type of clearance is required (medical clearance vs. Pharmacy clearance to hold med vs. Both)? Pharmacy  Are there any medications that need to be held prior to surgery and how long? Eliquis 3 days prior  Practice name and name of physician performing surgery? [facility], Dr. Rexene Alberts  What is the office phone number? [phone]   7.   What is the office fax number? [phone]  8.   Anesthesia type (None, local, MAC, general) ? general   Selena Zobro 01/31/2021, 12:13 PM  _________________________________________________________________   (provider comments below)

## 2021-01-31 NOTE — Telephone Encounter (Signed)
CALLED PATIENT TO REMIND OF LAB APPT. ON 02-02-21, LVM FOR A RETURN CALL

## 2021-01-31 NOTE — Telephone Encounter (Signed)
Patient with diagnosis of afib on Eliquis 5 mg BID for anticoagulation.    Procedure: Brachytherapy space OAR Date of procedure: 02/06/21  CHA2DS2-VASc Score = 2  his indicates a 2.2% annual risk of stroke. The patient's score is based upon: CHF History: No HTN History: Yes Diabetes History: No Stroke History: No Vascular Disease History: Yes Age Score: 0 Gender Score: 0   CrCl 105 mL/min using adjusted body weight due to obesity Platelet count 264K  Per office protocol, patient can hold Eliquis for 3 days prior to procedure.    Patient should restart Eliquis on the evening of procedure or day after, at discretion of procedure MD

## 2021-01-31 NOTE — Telephone Encounter (Signed)
Clinical pharmacist to review Eliquis.  Patient was previously cleared to hold Eliquis prior to prostate biopsy in March 2022.

## 2021-02-02 ENCOUNTER — Encounter (HOSPITAL_BASED_OUTPATIENT_CLINIC_OR_DEPARTMENT_OTHER): Payer: Self-pay | Admitting: Urology

## 2021-02-02 ENCOUNTER — Encounter (HOSPITAL_COMMUNITY)
Admission: RE | Admit: 2021-02-02 | Discharge: 2021-02-02 | Disposition: A | Discharge status: Home / Self Care | Payer: BC Managed Care – PPO | Source: Ambulatory Visit | Attending: Urology | Admitting: Urology

## 2021-02-02 ENCOUNTER — Other Ambulatory Visit: Payer: Self-pay

## 2021-02-02 DIAGNOSIS — Z01812 Encounter for preprocedural laboratory examination: Secondary | ICD-10-CM | POA: Diagnosis not present

## 2021-02-02 LAB — COMPREHENSIVE METABOLIC PANEL
ALT: 21 U/L (ref 0–44)
AST: 19 U/L (ref 15–41)
Albumin: 3.9 g/dL (ref 3.5–5.0)
Alkaline Phosphatase: 59 U/L (ref 38–126)
Anion gap: 6 (ref 5–15)
BUN: 12 mg/dL (ref 6–20)
CO2: 26 mmol/L (ref 22–32)
Calcium: 8.6 mg/dL — ABNORMAL LOW (ref 8.9–10.3)
Chloride: 106 mmol/L (ref 98–111)
Creatinine, Ser: 0.86 mg/dL (ref 0.61–1.24)
GFR, Estimated: >60 mL/min (ref >60)
Glucose, Bld: 96 mg/dL (ref 70–99)
Potassium: 3.7 mmol/L (ref 3.5–5.1)
Sodium: 138 mmol/L (ref 135–145)
Total Bilirubin: 0.8 mg/dL (ref 0.3–1.2)
Total Protein: 6.8 g/dL (ref 6.5–8.1)

## 2021-02-02 LAB — CBC
HCT: 43.3 % (ref 39.0–52.0)
Hemoglobin: 14.8 g/dL (ref 13.0–17.0)
MCH: 29.5 pg (ref 26.0–34.0)
MCHC: 34.2 g/dL (ref 30.0–36.0)
MCV: 86.3 fL (ref 80.0–100.0)
Platelets: 206 10*3/uL (ref 150–400)
RBC: 5.02 MIL/uL (ref 4.22–5.81)
RDW: 13.2 % (ref 11.5–15.5)
WBC: 6.7 10*3/uL (ref 4.0–10.5)
nRBC: 0 % (ref 0.0–0.2)

## 2021-02-02 LAB — APTT: aPTT: 38 seconds — ABNORMAL HIGH (ref 24–36)

## 2021-02-02 LAB — PROTIME-INR
INR: 1.2 (ref 0.8–1.2)
Prothrombin Time: 14.9 seconds (ref 11.4–15.2)

## 2021-02-02 NOTE — Progress Notes (Signed)
Spoke w/ via phone for pre-op interview--- Pt Lab needs dos----  no             Lab results------ pt had labs done 02-02-2021 CBC/ CMP/ PT/  PTT results in epic;  current ekg/ cxr in epic chart COVID test -----patient states asymptomatic no test needed  Arrive at -------  1100 on 02-06-2021 NPO after MN NO Solid Food.  Clear liquids from MN until--- 1000 Med rec completed Medications to take morning of surgery ----- Claritin, Crestor Diabetic medication ----- n/a Patient instructed no nail polish to be worn day of surgery Patient instructed to bring photo id and insurance card day of surgery  Patient aware to have Driver (ride ) / caregiver  for 24 hours after surgery --wife, [facility] Patient Special Instructions ----- will do one fleet enema morning of surgery Pre-Op special Istructions ----- pt has telephone cardiac clearance by Almyra Deforest PA on 01-31-2021 in epic/ chart  Patient verbalized understanding of instructions that were given at this phone interview. Patient denies shortness of breath, chest pain, fever, cough at this phone interview.    Anesthesia Review: HTN;  PAF s/p DCCV 05/ 2021;  pt denies cardiac s&s,, sob, and no peripheral swelling  PCP: Dr Caffie Damme Cardiologist : Dr C. Leary Roca 09-16-2020 epic) Chest x-ray :  01-05-2021;  chest CT 09-29-2020 EKG : 09-16-2020 epic Echo : 09-23-2019 epic Stress test: per cardiology note had one approx. 2010 was normal Coronary CT:  09-05-2018 Cardiac Cath : no Activity level: denies sob w/ any activity  Blood Thinner/ Instructions /Last Dose:  Eliquis ASA / Instructions/ Last Dose :  no Per pt was given instructions from Dr Abner Greenspan office to stop eliquis 3 days prior to surgery, stated last dose will be evening of 02-02-2021

## 2021-02-03 ENCOUNTER — Other Ambulatory Visit: Payer: Self-pay | Admitting: Urology

## 2021-02-03 ENCOUNTER — Telehealth: Payer: Self-pay | Admitting: *Deleted

## 2021-02-03 NOTE — Telephone Encounter (Signed)
CALLED PATIENT TO REMIND OF PROCEDURE FOR 02-06-21, SPOKE WITH PATIENT AND HE IS AWARE OF THIS PROCEDURE

## 2021-02-06 ENCOUNTER — Other Ambulatory Visit: Payer: Self-pay

## 2021-02-06 ENCOUNTER — Encounter (HOSPITAL_BASED_OUTPATIENT_CLINIC_OR_DEPARTMENT_OTHER): Payer: Self-pay | Admitting: Urology

## 2021-02-06 ENCOUNTER — Ambulatory Visit (HOSPITAL_BASED_OUTPATIENT_CLINIC_OR_DEPARTMENT_OTHER): Payer: BC Managed Care – PPO | Admitting: Certified Registered"

## 2021-02-06 ENCOUNTER — Ambulatory Visit (HOSPITAL_COMMUNITY): Payer: BC Managed Care – PPO

## 2021-02-06 ENCOUNTER — Ambulatory Visit (HOSPITAL_BASED_OUTPATIENT_CLINIC_OR_DEPARTMENT_OTHER)
Admission: RE | Admit: 2021-02-06 | Discharge: 2021-02-06 | Disposition: A | Discharge status: Home / Self Care | Payer: BC Managed Care – PPO | Attending: Urology | Admitting: Urology

## 2021-02-06 ENCOUNTER — Encounter (HOSPITAL_BASED_OUTPATIENT_CLINIC_OR_DEPARTMENT_OTHER)
Admission: RE | Disposition: A | Discharge status: Home / Self Care | Payer: Self-pay | Source: Home / Self Care | Attending: Urology

## 2021-02-06 DIAGNOSIS — Z8249 Family history of ischemic heart disease and other diseases of the circulatory system: Secondary | ICD-10-CM | POA: Insufficient documentation

## 2021-02-06 DIAGNOSIS — E669 Obesity, unspecified: Secondary | ICD-10-CM | POA: Diagnosis not present

## 2021-02-06 DIAGNOSIS — Z79899 Other long term (current) drug therapy: Secondary | ICD-10-CM | POA: Diagnosis not present

## 2021-02-06 DIAGNOSIS — Z823 Family history of stroke: Secondary | ICD-10-CM | POA: Diagnosis not present

## 2021-02-06 DIAGNOSIS — Z801 Family history of malignant neoplasm of trachea, bronchus and lung: Secondary | ICD-10-CM | POA: Diagnosis not present

## 2021-02-06 DIAGNOSIS — Z825 Family history of asthma and other chronic lower respiratory diseases: Secondary | ICD-10-CM | POA: Diagnosis not present

## 2021-02-06 DIAGNOSIS — Z833 Family history of diabetes mellitus: Secondary | ICD-10-CM | POA: Diagnosis not present

## 2021-02-06 DIAGNOSIS — Z803 Family history of malignant neoplasm of breast: Secondary | ICD-10-CM | POA: Insufficient documentation

## 2021-02-06 DIAGNOSIS — I10 Essential (primary) hypertension: Secondary | ICD-10-CM | POA: Diagnosis not present

## 2021-02-06 DIAGNOSIS — F419 Anxiety disorder, unspecified: Secondary | ICD-10-CM | POA: Diagnosis not present

## 2021-02-06 DIAGNOSIS — C61 Malignant neoplasm of prostate: Secondary | ICD-10-CM | POA: Diagnosis not present

## 2021-02-06 DIAGNOSIS — Z7982 Long term (current) use of aspirin: Secondary | ICD-10-CM | POA: Diagnosis not present

## 2021-02-06 DIAGNOSIS — Z7901 Long term (current) use of anticoagulants: Secondary | ICD-10-CM | POA: Diagnosis not present

## 2021-02-06 DIAGNOSIS — F17290 Nicotine dependence, other tobacco product, uncomplicated: Secondary | ICD-10-CM | POA: Insufficient documentation

## 2021-02-06 DIAGNOSIS — F17210 Nicotine dependence, cigarettes, uncomplicated: Secondary | ICD-10-CM | POA: Diagnosis not present

## 2021-02-06 DIAGNOSIS — Z6834 Body mass index (BMI) 34.0-34.9, adult: Secondary | ICD-10-CM | POA: Insufficient documentation

## 2021-02-06 DIAGNOSIS — E785 Hyperlipidemia, unspecified: Secondary | ICD-10-CM | POA: Diagnosis not present

## 2021-02-06 HISTORY — DX: Atherosclerotic heart disease of native coronary artery without angina pectoris: I25.10

## 2021-02-06 HISTORY — DX: Long term (current) use of anticoagulants: Z79.01

## 2021-02-06 HISTORY — DX: Emphysema, unspecified: J43.9

## 2021-02-06 HISTORY — DX: Other nonspecific abnormal finding of lung field: R91.8

## 2021-02-06 HISTORY — DX: Paroxysmal atrial fibrillation: I48.0

## 2021-02-06 HISTORY — PX: RADIOACTIVE SEED IMPLANT: SHX5150

## 2021-02-06 SURGERY — INSERTION, RADIATION SOURCE, PROSTATE
Anesthesia: General | Site: Prostate

## 2021-02-06 MED ORDER — DEXAMETHASONE SODIUM PHOSPHATE 10 MG/ML IJ SOLN
INTRAMUSCULAR | Status: DC | PRN
Start: 2021-02-06 — End: 2021-02-06
  Administered 2021-02-06: 10 mg via INTRAVENOUS

## 2021-02-06 MED ORDER — FLEET ENEMA 7-19 GM/118ML RE ENEM: 1.0000 | ENEMA | Freq: Once | RECTAL | Status: DC

## 2021-02-06 MED ORDER — PROPOFOL 10 MG/ML IV BOLUS
INTRAVENOUS | Status: AC
  Filled 2021-02-06: qty 20

## 2021-02-06 MED ORDER — PROMETHAZINE HCL 25 MG/ML IJ SOLN: 6.2500 mg | INTRAMUSCULAR | Status: DC | PRN

## 2021-02-06 MED ORDER — FENTANYL CITRATE (PF) 100 MCG/2ML IJ SOLN: 25.0000 ug | INTRAMUSCULAR | Status: DC | PRN

## 2021-02-06 MED ORDER — SODIUM CHLORIDE 0.9 % IR SOLN
Status: DC | PRN
  Administered 2021-02-06: 200 mL

## 2021-02-06 MED ORDER — MIDAZOLAM HCL 5 MG/5ML IJ SOLN
INTRAMUSCULAR | Status: DC | PRN
  Administered 2021-02-06: 2 mg via INTRAVENOUS

## 2021-02-06 MED ORDER — ONDANSETRON HCL 4 MG/2ML IJ SOLN
INTRAMUSCULAR | Status: AC
  Filled 2021-02-06: qty 2

## 2021-02-06 MED ORDER — LIDOCAINE HCL (PF) 2 % IJ SOLN
INTRAMUSCULAR | Status: AC
  Filled 2021-02-06: qty 5

## 2021-02-06 MED ORDER — PROPOFOL 10 MG/ML IV BOLUS
INTRAVENOUS | Status: DC | PRN
  Administered 2021-02-06: 200 mg via INTRAVENOUS
  Administered 2021-02-06: 20 mg via INTRAVENOUS
  Administered 2021-02-06: 30 mg via INTRAVENOUS

## 2021-02-06 MED ORDER — CEPHALEXIN 500 MG PO CAPS: 500.0000 mg | ORAL_CAPSULE | Freq: Two times a day (BID) | ORAL | 0 refills | Status: AC

## 2021-02-06 MED ORDER — CIPROFLOXACIN IN D5W 400 MG/200ML IV SOLN
INTRAVENOUS | Status: AC
  Filled 2021-02-06: qty 200

## 2021-02-06 MED ORDER — MIDAZOLAM HCL 2 MG/2ML IJ SOLN
INTRAMUSCULAR | Status: AC
  Filled 2021-02-06: qty 2

## 2021-02-06 MED ORDER — CIPROFLOXACIN IN D5W 400 MG/200ML IV SOLN
400.0000 mg | INTRAVENOUS | Status: AC
  Administered 2021-02-06: 400 mg via INTRAVENOUS

## 2021-02-06 MED ORDER — ONDANSETRON HCL 4 MG/2ML IJ SOLN
INTRAMUSCULAR | Status: DC | PRN
  Administered 2021-02-06: 4 mg via INTRAVENOUS

## 2021-02-06 MED ORDER — DOCUSATE SODIUM 100 MG PO CAPS: 100.0000 mg | ORAL_CAPSULE | Freq: Every day | ORAL | 0 refills | Status: DC | PRN

## 2021-02-06 MED ORDER — LACTATED RINGERS IV SOLN: INTRAVENOUS | Status: DC

## 2021-02-06 MED ORDER — FENTANYL CITRATE (PF) 100 MCG/2ML IJ SOLN
INTRAMUSCULAR | Status: DC | PRN
  Administered 2021-02-06: 50 ug via INTRAVENOUS
  Administered 2021-02-06 (×2): 25 ug via INTRAVENOUS

## 2021-02-06 MED ORDER — IOHEXOL 300 MG/ML  SOLN
INTRAMUSCULAR | Status: DC | PRN
  Administered 2021-02-06: 7 mL

## 2021-02-06 MED ORDER — FENTANYL CITRATE (PF) 100 MCG/2ML IJ SOLN
INTRAMUSCULAR | Status: AC
  Filled 2021-02-06: qty 2

## 2021-02-06 MED ORDER — OXYCODONE HCL 5 MG PO TABS: 5.0000 mg | ORAL_TABLET | Freq: Once | ORAL | Status: DC | PRN

## 2021-02-06 MED ORDER — OXYCODONE-ACETAMINOPHEN 5-325 MG PO TABS: 1.0000 | ORAL_TABLET | ORAL | 0 refills | Status: DC | PRN

## 2021-02-06 MED ORDER — OXYCODONE HCL 5 MG/5ML PO SOLN
5.0000 mg | Freq: Once | ORAL | Status: DC | PRN
Start: 2021-02-06 — End: 2021-02-06

## 2021-02-06 MED ORDER — LIDOCAINE 2% (20 MG/ML) 5 ML SYRINGE
INTRAMUSCULAR | Status: DC | PRN
  Administered 2021-02-06: 80 mg via INTRAVENOUS

## 2021-02-06 MED ORDER — DEXAMETHASONE SODIUM PHOSPHATE 10 MG/ML IJ SOLN
INTRAMUSCULAR | Status: AC
  Filled 2021-02-06: qty 1

## 2021-02-06 SURGICAL SUPPLY — 35 items
BAG DRN RND TRDRP ANRFLXCHMBR (UROLOGICAL SUPPLIES) ×1
BAG URINE DRAIN 2000ML AR STRL (UROLOGICAL SUPPLIES) ×2 IMPLANT
BLADE CLIPPER SENSICLIP SURGIC (BLADE) ×2 IMPLANT
CATH FOLEY 2WAY SLVR  5CC 16FR (CATHETERS) ×1
CATH FOLEY 2WAY SLVR 5CC 16FR (CATHETERS) ×1 IMPLANT
CATH ROBINSON RED A/P 16FR (CATHETERS) IMPLANT
CATH ROBINSON RED A/P 20FR (CATHETERS) ×2 IMPLANT
CLOTH BEACON ORANGE TIMEOUT ST (SAFETY) ×2 IMPLANT
CNTNR URN SCR LID CUP LEK RST (MISCELLANEOUS) ×2 IMPLANT
CONT SPEC 4OZ STRL OR WHT (MISCELLANEOUS) ×4
COVER BACK TABLE 60X90IN (DRAPES) ×2 IMPLANT
COVER MAYO STAND STRL (DRAPES) ×2 IMPLANT
DRAPE C-ARM 35X43 STRL (DRAPES) ×2 IMPLANT
DRSG TEGADERM 4X4.75 (GAUZE/BANDAGES/DRESSINGS) ×2 IMPLANT
DRSG TEGADERM 8X12 (GAUZE/BANDAGES/DRESSINGS) ×2 IMPLANT
GAUZE SPONGE 4X4 12PLY STRL LF (GAUZE/BANDAGES/DRESSINGS) ×2 IMPLANT
GLOVE SURG ENC MOIS LTX SZ6 (GLOVE) IMPLANT
GLOVE SURG ENC MOIS LTX SZ6.5 (GLOVE) IMPLANT
GLOVE SURG ENC MOIS LTX SZ7 (GLOVE) ×2 IMPLANT
GLOVE SURG ENC MOIS LTX SZ8 (GLOVE) IMPLANT
GLOVE SURG ORTHO LTX SZ8.5 (GLOVE) IMPLANT
GLOVE SURG UNDER POLY LF SZ6.5 (GLOVE) IMPLANT
GOWN STRL REUS W/TWL LRG LVL3 (GOWN DISPOSABLE) ×2 IMPLANT
HOLDER FOLEY CATH W/STRAP (MISCELLANEOUS) IMPLANT
ISEED AGX100 ×148 IMPLANT
IV NS 1000ML (IV SOLUTION) ×2
IV NS 1000ML BAXH (IV SOLUTION) ×1 IMPLANT
KIT TURNOVER CYSTO (KITS) ×2 IMPLANT
MARKER SKIN DUAL TIP RULER LAB (MISCELLANEOUS) ×2 IMPLANT
PACK CYSTO (CUSTOM PROCEDURE TRAY) ×2 IMPLANT
SUT BONE WAX W31G (SUTURE) IMPLANT
SYR 10ML LL (SYRINGE) ×2 IMPLANT
TOWEL OR 17X26 10 PK STRL BLUE (TOWEL DISPOSABLE) ×2 IMPLANT
UNDERPAD 30X36 HEAVY ABSORB (UNDERPADS AND DIAPERS) ×4 IMPLANT
WATER STERILE IRR 500ML POUR (IV SOLUTION) ×2 IMPLANT

## 2021-02-06 NOTE — Anesthesia Preprocedure Evaluation (Addendum)
Anesthesia Evaluation  Patient identified by MRN, date of birth, ID band Patient awake    Reviewed: Allergy & Precautions, NPO status , Patient's Chart, lab work & pertinent test results, reviewed documented beta blocker date and time   History of Anesthesia Complications Negative for: history of anesthetic complications  Airway Mallampati: III  TM Distance: >3 FB Neck ROM: Full    Dental  (+) Dental Advisory Given   Pulmonary COPD, Current Smoker and Patient abstained from smoking.,    Pulmonary exam normal        Cardiovascular hypertension, Pt. on medications and Pt. on home beta blockers + CAD  Normal cardiovascular exam+ dysrhythmias Atrial Fibrillation      Neuro/Psych PSYCHIATRIC DISORDERS Anxiety negative neurological ROS     GI/Hepatic negative GI ROS, Neg liver ROS,   Endo/Other   Obesity   Renal/GU negative Renal ROS    Prostate cancer     Musculoskeletal negative musculoskeletal ROS (+)   Abdominal   Peds  Hematology  On eliquis    Anesthesia Other Findings   Reproductive/Obstetrics                            Anesthesia Physical Anesthesia Plan  ASA: 3  Anesthesia Plan: General   Post-op Pain Management:    Induction: Intravenous  PONV Risk Score and Plan: 2 and Treatment may vary due to age or medical condition, Ondansetron, Dexamethasone and Midazolam  Airway Management Planned: LMA  Additional Equipment: None  Intra-op Plan:   Post-operative Plan: Extubation in OR  Informed Consent: I have reviewed the patients History and Physical, chart, labs and discussed the procedure including the risks, benefits and alternatives for the proposed anesthesia with the patient or authorized representative who has indicated his/her understanding and acceptance.     Dental advisory given  Plan Discussed with: CRNA and Anesthesiologist  Anesthesia Plan Comments:         Anesthesia Quick Evaluation

## 2021-02-06 NOTE — Transfer of Care (Signed)
Immediate Anesthesia Transfer of Care Note  Patient: James Weeks  Procedure(s) Performed: RADIOACTIVE SEED IMPLANT/BRACHYTHERAPY IMPLANT WITH CYSTOSCOPY (Prostate)  Patient Location: PACU  Anesthesia Type:General  Level of Consciousness: awake, alert  and oriented  Airway & Oxygen Therapy: Patient Spontanous Breathing and Patient connected to nasal cannula oxygen  Post-op Assessment: Report given to RN and Post -op Vital signs reviewed and stable  Post vital signs: Reviewed and stable  Last Vitals:  Vitals Value Taken Time  BP 155/95 02/06/21 1415  Temp    Pulse 80 02/06/21 1416  Resp 10 02/06/21 1416  SpO2 98 % 02/06/21 1416  Vitals shown include unvalidated device data.  Last Pain:  Vitals:   02/06/21 1116  TempSrc: Oral  PainSc: 0-No pain         Complications: No notable events documented.

## 2021-02-06 NOTE — Progress Notes (Signed)
  Radiation Oncology         [phone] ________________________________  Name: James Weeks MRN: [id number]  Date: 02/06/2021  DOB: [birth year]       Prostate Seed Implant  [id number], James Weeks., MD  No ref. provider found  DIAGNOSIS:  60 y.o. gentleman with stage T1c adenocarcinoma of the prostate with a Gleason's score of 3+4 and a PSA of 5.18  Oncology History  Malignant neoplasm of prostate ([place])  09/28/2020 Cancer Staging   Staging form: Prostate, AJCC 8th Edition - Clinical stage from 09/28/2020: Stage IIB (cT1c, cN0, cM0, PSA: 5.2, Grade Group: 2) - Signed by Freeman Caldron, PA-C on 10/20/2020 Histopathologic type: Adenocarcinoma, NOS Stage prefix: Initial diagnosis Prostate specific antigen (PSA) range: Less than 10 Gleason primary pattern: 3 Gleason secondary pattern: 4 Gleason score: 7 Histologic grading system: 5 grade system Number of biopsy cores examined: 16 Number of biopsy cores positive: 10 Location of positive needle core biopsies: Both sides   10/20/2020 Initial Diagnosis   Malignant neoplasm of prostate ([place])       ICD-10-CM   1. Malignant neoplasm of prostate [facility])  C61 Discharge patient      PROCEDURE: Insertion of radioactive I-125 seeds into the prostate gland.  RADIATION DOSE: 145 Gy, definitive therapy.  TECHNIQUE: James Weeks was brought to the operating room with the urologist. He was placed in the dorsolithotomy position. He was catheterized and a rectal tube was inserted. The perineum was shaved, prepped and draped. The ultrasound probe was then introduced into the rectum to see the prostate gland.  TREATMENT DEVICE: A needle grid was attached to the ultrasound probe stand and anchor needles were placed.  3D PLANNING: The prostate was imaged in 3D using a sagittal sweep of the prostate probe. These images were transferred to the planning computer. There, the prostate, urethra and rectum were defined on each axial reconstructed image.  Then, the software created an optimized 3D plan and a few seed positions were adjusted. The quality of the plan was reviewed using [facility] information for the target and the following two organs at risk:  Urethra and Rectum.  Then the accepted plan was printed and handed off to the radiation therapist.  Under my supervision, the custom loading of the seeds and spacers was carried out and loaded into sealed vicryl sleeves.  These pre-loaded needles were then placed into the needle holder.Marland Kitchen  PROSTATE VOLUME STUDY:  Using transrectal ultrasound the volume of the prostate was verified to be 36.1 cc.  SPECIAL TREATMENT PROCEDURE/SUPERVISION AND HANDLING: The pre-loaded needles were then delivered under sagittal guidance. A total of 19 needles were used to deposit 74 seeds in the prostate gland. The individual seed activity was 0.365 mCi.  SpaceOAR:  No  COMPLEX SIMULATION: At the end of the procedure, an anterior radiograph of the pelvis was obtained to document seed positioning and count. Cystoscopy was performed to check the urethra and bladder.  MICRODOSIMETRY: At the end of the procedure, the patient was emitting 0.056 mR/hr at 1 meter. Accordingly, he was considered safe for hospital discharge.  PLAN: The patient will return to the radiation oncology clinic for post implant CT dosimetry in three weeks.   ________________________________  Sheral Apley Tammi Klippel, M.D.

## 2021-02-06 NOTE — Discharge Instructions (Addendum)
Activity:  You are encouraged to ambulate frequently (about every hour during waking hours) to help prevent blood clots from forming in your legs or lungs.    Diet: You should advance your diet as instructed by your physician.  It will be normal to have some bloating, nausea, and abdominal discomfort intermittently.  Prescriptions:  You will be provided a prescription for pain medication to take as needed.  If your pain is not severe enough to require the prescription pain medication, you may take extra strength Tylenol instead which will have less side effects.  You should also take a prescribed stool softener to avoid straining with bowel movements as the prescription pain medication may constipate you.  What to call us about: You should call the office [phone]) if you develop fever > 101 or develop persistent vomiting. Activity:  You are encouraged to ambulate frequently (about every hour during waking hours) to help prevent blood clots from forming in your legs or lungs.     PROSTATE CANCER TREATMENT WITH RADIOACTIVE IODINE-125 SEED IMPLANT  This instruction sheet is intended to discuss implantation of Iodine-125 seeds as treatment for cancer of the prostate. It will explain in detail what you may expect from this treatment and what precautions are necessary as a result of the treatment. Iodine-125 emits a relatively low energy radiation. The radioactive seeds are surgically implanted directly into the prostate gland. Most of the radiation is contained within the prostate gland. A very small amount is present outside the body.The precautions that we ask you to take are to ensure that those around you are protected from unnecessary radiation. The principles of radiation safety that you need to understand are:  DISTANCE: The further a person is from the radioactive implant the less radiation they will be receiving. The amount of radiation received falls off quite rapidly with distance. More  specific guidelines are given in the table on the last page.  TIME: The amount of radiation a person is exposed to is directly proportional to the amount of time that is spent in close proximity to the radioactive implant. Very little radiation will be received during short periods. See the table on the last page for more specific guideline.  CHILDREN UNDER AGE 44 Children should not be allowed to sit on your lap or otherwise be in very close contact for more than a few minutes for the first 6-8 weeks following the implant. You may affectionately greet (hug/kiss) a child for a short period of time, but remember, the longer you are in close proximity with that child the more radiation they are being exposed to. At a distance of 6 feet there is no limit to the length of time you may spend together. See specific guidelines on the last page.  PREGNANT OR POSSIBLY PREGNANT WOMEN Pregnant women should avoid prolonged close physical contact with you for the first 6-8 weeks after implant. At a distance of 6 feet there is no limit to the length of time you may spend together. Pregnant women or possibly pregnant women can safely be in close contact with you for a limited period of time. See the last page for guidelines.  FAMILY RELATIONS You may sleep in the same bed as your partner (provided she is not pregnant or under the age of 64). Sexual intercourse, using a condom, may be resumed 2 weeks after the implant. Your semen may be discolored, dark brown or black. This is normal and is the result of bleeding that may  have occurred during the implant. After 3-4 weeks it will not be necessary to use a condom.  DAILY ACTIVITIES You may resume normal activities in a few days (example: work, shopping, church) without the risk of harmful radiation exposure to those around you provided you keep in mind the time and distance precautions. Objects that you touch or item that you use do not become radioactive. Linens,  clothing, tableware, and dishes may be used by other persons without special precautions. Your bodily wastes (urine and stool) are not radioactive.  SPECIAL PRECAUTIONS It is possible to lose implanted Iodine-125 seed(s) through urination. Although it is possible to pass seeds indefinitely, it is most likely to occur immediately after catheter removal. To prevent this from happening the catheter that was in place during the implant procedure is removed immediately after the implant and a cystoscopy procedure is performed. The process of removing the catheter and the cystoscopy procedure should dislodge and remove any seeds that are not firmly imbedded in the prostate tissue. However, you should watch for seeds if/when you remove your catheter at home. The seeds are silver colored and the size of a grain of rice. In the unlikely event that a seed is seen after urination, simply flush the seed down the toilet. The seed should not be handled with your fingers, not even with a glove or napkin. A spoon or tweezers can be used to pick up a seed. The Radiation Oncology department is open Monday - Friday from 8:00 am to 5:30 pm with a Radiation Oncologist on call at all times. He or she may be reached by calling [phone]. If you are to be hospitalized or if death should occur, your family should notify the Runner, broadcasting/film/video.  SIDE EFFECTS There are very few side effects associate with the implant procedure. Minor burning with urination, weak stream, hesitancy, intermittency, frequency, mild pain or feeling unable to pass your urine freely are common and usually stop in one to four months. If these symptoms are extremely uncomfortable, contact your physician.  RADIATION SAFETY GUIDELINES PROSTATE CANCER TREATMENT WITH RADIOACTIVE IODINE-125 SEED IMPLANT  The following guidelines will limit exposure to less than naturally occurring background radiation.  PERSONS AGE 74-45 (if able to become  pregnant)  FOR 8 WEEKS FOLLOWING IMPLANT  At a distance of 1 foot: limit time to less than 2 hours/week At a distance of 3 feet: limit time to 20 hours/week At a distance of 6 feet: no restrictions  AFTER 8 WEEKS No restrictions  CHILDREN UNDER AGE 74, PREGNANT WOMEN OR POSSIBLY PREGNANT WOMEN  FOR 8 WEEKS FOLLOWING IMPLANT At a distance of 1 foot: limit time to 10 minutes/week At a distance of 3 feet: limit time to 2 hours/week At a distance of 6 feet: no restrictions  AFTER 8 WEEKS No restrictions  PERSONS OVER THE AGE OF 45 AND DO NOT EXPECT TO HAVE ANY MORE CHILDREN No restrictions  Updated by [facility] in January 2020  Post Anesthesia Home Care Instructions  Activity: Get plenty of rest for the remainder of the day. A responsible individual must stay with you for 24 hours following the procedure.  For the next 24 hours, DO NOT: -Drive a car -Paediatric nurse -Drink alcoholic beverages -Take any medication unless instructed by your physician -Make any legal decisions or sign important papers.  Meals: Start with liquid foods such as gelatin or soup. Progress to regular foods as tolerated. Avoid greasy, spicy, heavy foods. If nausea and/or vomiting occur, drink  only clear liquids until the nausea and/or vomiting subsides. Call your physician if vomiting continues.  Special Instructions/Symptoms: Your throat may feel dry or sore from the anesthesia or the breathing tube placed in your throat during surgery. If this causes discomfort, gargle with warm salt water. The discomfort should disappear within 24 hours.

## 2021-02-06 NOTE — Op Note (Signed)
PATIENT:  James Weeks  PRE-OPERATIVE DIAGNOSIS:  Adenocarcinoma of the prostate  POST-OPERATIVE DIAGNOSIS:  Same  PROCEDURE:  1. I-125 radioactive seed implantation 2. Cystoscopy   SURGEON:  Rexene Alberts, MD  Radiation oncologist: Tyler Pita, MD  ANESTHESIA:  General  EBL:  Minimal  DRAINS: None  INDICATION: James Hong Wigen  Description of procedure: After informed consent the patient was brought to the major OR, placed on the table and administered general anesthesia. He was then moved to the modified lithotomy position with his perineum perpendicular to the floor. His perineum and genitalia were then sterilely prepped. An official timeout was then performed. A 16 French Foley catheter was then placed in the bladder and filled with dilute contrast, a rectal tube was placed in the rectum and the transrectal ultrasound probe was placed in the rectum and affixed to the stand. He was then sterilely draped.  Real time ultrasonography was used along with the seed planning software. This was used to develop the seed plan including the number of needles as well as number of seeds required for complete and adequate coverage. Real-time ultrasonography was then used along with the previously developed plan and the Nucletron device to implant a total of 74 seeds using 19 needles. This proceeded without difficulty or complication.   A Foley catheter was then removed as well as the transrectal ultrasound probe and rectal probe. Flexible cystoscopy was then performed using the 16 French flexible scope which revealed a normal urethra throughout its length down to the sphincter which appeared intact. The prostatic urethra revealed bilobar hypertrophy but no evidence of obstruction, seeds, spacers or lesions. The bladder was then entered and fully and systematically inspected. The ureteral orifices were noted to be of normal configuration and position. The mucosa revealed no evidence of  tumors. There were also no stones identified within the bladder. I noted no seeds or spacers on the floor of the bladder and retroflexion of the scope revealed no seeds protruding from the base of the prostate.  The cystoscope was then removed and the patient was awakened and taken to recovery room in stable and satisfactory condition. He tolerated procedure well and there were no intraoperative complications.  [facility]  Pager: [phone]

## 2021-02-06 NOTE — H&P (Signed)
CC/HPI: James Weeks is a 60 year old male seen in follow-up with localized prostate cancer.   He was diagnosed on 10/07/2018 with prebiopsy PSA of 4.8. Pathology revealed clinical stage T1 cNX MX Gleason score 3+3 = 6 in 3/12 cores (10%). TRUS volume 29 cc. He obtained a confirmatory biopsy in order 06/16/2019 with pathology revealing 3+4 = 7 and 1/12 core (50%), 3+3 = 6 in 3/12 cores. He elected to proceed with active surveillance. PSA on 09/02/2020 was 4.64.   MRI prostate 08/16/2020 revealed a PI-RADS category 5 lesion in the right mid gland peripheral zone with prostate volume 27.7 cc.   MRI fusion biopsy 09/28/2020 revealed GS 3+4 = 7 in 5/12 cores and GS 3+3 = 6 in 1/12 core with 6/12 total cores positive. TRUS volume 27 cc. Denies new or worsening bone or back pain. Good appetite and stable weight.   Family history: Barbaraann Rondo (father's brother)  Imaging studies: MRI prostate 08/16/2020 with PI-RADS category 5 lesion in the right mid gland peripheral zone. No evidence of extracapsular extension, seminal vesicle invasion, or metastatic disease.   PMH: HTN  PSH: None   TNM stage: cT1cN0Mx  PSA: 4.64 on 09/02/2020  Gleason score: GS 3+4 = 7 in 5/12 cores and GS 3+3 = 6 in 1/12 core  Biopsy: 10/07/2018, 06/16/2019, 09/02/2020  Left: 3+3 = 6 at the left lateral base  Right: 3+4 = 7 at the right base, right mid, right lateral mid, right apex, right lateral apex  Prostate volume: 27 cc  PSAD: 0.17   Nomogram  CSS (5 year, 10 year): 99%, 99%  PFS (5 year, 10 year): 85%, 75%  EPE: 37%  LNI: 4%  SVI: 4%   IPSS: 8, QOL 2  SHIM: He did not complete this form. Denies difficulty with erections.   He met with Dr. Tammi Klippel and has elected to proceed with brachytherapy. He is scheduled for brachytherapy on 02/06/2021.     ALLERGIES: None   MEDICATIONS: Aspirin  Metoprolol Succinate 50 mg tablet, extended release 24 hr  Tamsulosin Hcl 0.4 mg capsule 1 capsule PO Q PM  Tamsulosin Hcl 0.4 mg  capsule  Amlodipine Besylate 5 mg tablet  Claritin  Diazepam 10 mg tablet  Eliquis 5 mg tablet  Loratadine 10 mg tablet  Losartan Potassium 100 mg tablet     GU PSH: Prostate Needle Biopsy - 09/28/2020, 06/16/2019, 2020     NON-GU PSH: Surgical Pathology, Gross And Microscopic Examination For Prostate Needle - 09/28/2020, 06/16/2019, 2020     GU PMH: Prostate Cancer - 10/06/2020, - 09/28/2020, - 07/21/2020, - 07/06/2019, - 06/16/2019 (Stable), His prostate was unchanged to exam today. In addition his PSA remains stable at 4.61. We discussed proceeding with confirmatory biopsy. The be scheduled for that., - 03/26/2019, He has low risk prostate cancer and is considering possible active surveillance but will review all options and make a decision., - 2020 Nocturia - 07/21/2020 Elevated PSA, The patient has elected to proceed with prostate biopsy and therefore will stop his daily aspirin in preparation. - 2020    NON-GU PMH: Anxiety Hypertension    FAMILY HISTORY: None   SOCIAL HISTORY: Marital Status: Married Preferred Language: English; Ethnicity: Not Hispanic Or Latino; Race: White Current Smoking Status: Patient does not smoke anymore.   Tobacco Use Assessment Completed: Used Tobacco in last 30 days? Does drink.  Drinks 2 caffeinated drinks per day.    REVIEW OF SYSTEMS:    GU Review Male:   Patient denies frequent urination,  hard to postpone urination, burning/ pain with urination, get up at night to urinate, leakage of urine, stream starts and stops, trouble starting your stream, have to strain to urinate , erection problems, and penile pain.  Gastrointestinal (Upper):   Patient denies nausea, vomiting, and indigestion/ heartburn.  Gastrointestinal (Lower):   Patient denies diarrhea and constipation.  Constitutional:   Patient denies fever, night sweats, weight loss, and fatigue.  Skin:   Patient denies skin rash/ lesion and itching.  Eyes:   Patient denies blurred vision and double  vision.  Ears/ Nose/ Throat:   Patient denies sore throat and sinus problems.  Hematologic/Lymphatic:   Patient denies swollen glands and easy bruising.  Cardiovascular:   Patient denies leg swelling and chest pains.  Respiratory:   Patient denies cough and shortness of breath.  Endocrine:   Patient denies excessive thirst.  Musculoskeletal:   Patient denies back pain and joint pain.  Neurological:   Patient denies headaches and dizziness.  Psychologic:   Patient denies depression and anxiety.   VITAL SIGNS: None   MULTI-SYSTEM PHYSICAL EXAMINATION:    Constitutional: Well-nourished. No physical deformities. Normally developed. Good grooming.  Respiratory: No labored breathing, no use of accessory muscles.   Cardiovascular: Normal temperature, normal extremity pulses, no swelling, no varicosities.  Gastrointestinal: No mass, no tenderness, no rigidity, non obese abdomen.     Complexity of Data:  Source Of History:  Patient, Medical Record Summary  Lab Test Review:   PSA  Records Review:   AUA Symptom Score, Pathology Reports, Previous Doctor Records, Previous Patient Records  Urine Test Review:   Urinalysis   07/21/20 10/06/19 03/25/19 08/26/18 07/17/18 06/13/17 07/13/16  PSA  Total PSA 5.18 ng/mL 4.54 ng/mL 4.61 ng/mL 4.8 ng/dl 4.9 ng/dl 4.0 ng/dl 4.7 ng/dl  Free PSA   0.59 ng/mL      % Free PSA   13 % PSA 11.9 %       PROCEDURES:          Urinalysis Dipstick Dipstick Cont'd  Color: Yellow Bilirubin: Neg mg/dL  Appearance: Clear Ketones: Neg mg/dL  Specific Gravity: 1.020 Blood: Neg ery/uL  pH: 6.0 Protein: Neg mg/dL  Glucose: Neg mg/dL Urobilinogen: 0.2 mg/dL    Nitrites: Neg    Leukocyte Esterase: Neg leu/uL    ASSESSMENT:      ICD-10 Details  1 GU:   Prostate Cancer - C61    PLAN:           Document Letter(s):  Created for Patient: Clinical Summary         Notes:    1. cT1cN0Mx GS 3+4 = 7 in 5/12 cores and GS 3+3 = 6 in 1/12 core with 6/12 total cores  positive, prostate volume 27 cc, prebiopsy PSA = 4.64 ng/mL  -Initially dx on 10/07/2018 with low risk GS 3+3 = 6  -Confirmatory biopsy 06/16/2019 with 3+4 = 7 in 1/12 core and 3+3 = 6 in 3/12 cores.  -He had been on active surveillance since 10/07/2018 until now  -Most recent MRI fusion biopsy 09/28/2020 with favorable intermediate risk  -Reviewed pathology report in detail with patient today. Explained that the patient has favorable intermediate risk disease based on his pathology report.  -Reviewed MSK nomogram as above  -Discussed treatment options for localized prostate cancer to include RALP vs XRT +/- ADT  -He met with Dr. Tammi Klippel and has elected to proceed with brachytherapy. He is scheduled for brachytherapy on 02/06/2021.  -We discussed risks and benefits  of brachytherapy with SpaceOAR injection.   Discussion:  Using NCCN risk stratification criteria, his disease is considered favorable intermediate risk. We discussed his diagnosis in detail, reviewing the significance of gleason score, PSA, DRE, and percentage of cores positive. We discussed the various management options for prostate cancer, including active surveillance, open and robotic radical prostatectomy, EBRT, brachytherapy, proton therapy, and cryotherapy. We discussed the generally indolent course of many prostate cancers, and the generally favorable oncologic control offered by all treatment strategies. However, I emphasized that all treatments offer only potential for cure and that in many cases multimodal therapy may ultimately be utilized for long term cancer control. We also discussed the impact of treatment on sexual and urinary function. I emphasized that each treatment has unique quality of life impact and recovery profiles, and we reviewed the possible effects of surgery, radiation, and cryotherapy on quality of life outcomes. All questions were answered.    Brachytherapy/space OAR consent- The patient was counseled about the  natural history of prostate cancer and the standard treatment options that are available for prostate cancer. It was explained to him how his age and life expectancy, clinical stage, Gleason score, and PSA affect his prognosis, the decision to proceed with additional staging studies, as well as how that information influences recommended treatment strategies. We discussed the roles for active surveillance, radiation therapy, surgical therapy, androgen deprivation, as well as ablative therapy options for the treatment of prostate cancer as appropriate to his individual cancer situation. We discussed the risks and benefits of these options with regard to their impact on cancer control and also in terms of potential adverse events, complications, and impact on quality of life particularly related to urinary and sexual function. The patient was encouraged to ask questions throughout the discussion today and all questions were answered to his stated satisfaction. In addition, the patient was provided with and/or directed to appropriate resources and literature for further education about prostate cancer and treatment options.   The patient has decided to proceed with brachytherapy and SpaceOAR placement as primary treatment of his intermediate risk prostate cancer. The risks, benefits and alternatives of the aforementioned procedures was discussed in detail. Risks include, bur are not limited to worsening LUTS, erectile dysfunction, rectal irritation, urethral stricture formation, fistula formation, cancer recurrence, MI, CVA, PE, DVT and the inherent risk of general anesthesia. He voices understanding and wishes to proceed.    CC: Wende Neighbors, MD   Urology Preoperative H&P   Chief Complaint: Prostate cancer  History of Present Illness: James Weeks is a 60 y.o. male with prostate cancer here for brachytherapy. He denies fevers, chills.    Past Medical History:  Diagnosis Date   Allergic rhinitis  due to pollen    Anticoagulant long-term use    eliquis-- managed by cardiology   Anxiety    Emphysema lung ([place])    per chest CT in epic 09-29-2020   Hyperlipidemia    Hypertension    Mild CAD    cardiologist--- [name]---  per coronary CT 09-05-2018 mild cad involving midLAD, moderate torturous coronary arteries, calcium score =1   PAF (paroxysmal atrial fibrillation) [facility])    cardiologist-- [name]-- dx 03/ 2021 asymptomatic   Prostate cancer [facility])    urologist--- dr Paytan Recine--- dx 04/ 2020 Gleason 3+3 active survillence;  bx 12/ 2020 ,Gleason3+4 survillence;  MRI fusion bx 04/ 2022 Gleason 3+4   Pulmonary nodules    last chest CT in epic 09-29-2020 bilateral upper lobe nodules  stable, benign    Past Surgical History:  Procedure Laterality Date   CARDIOVERSION N/A 10/30/2019   Procedure: CARDIOVERSION;  Surgeon: [name], MD;  Location: [facility] ENDOSCOPY;  Service: Cardiovascular;  Laterality: N/A;   COLONOSCOPY WITH PROPOFOL  2012   PROSTATE BIOPSY      Allergies: No Known Allergies  Family History  Problem Relation Age of Onset   Hypertension Mother    Diabetes Mother    Breast cancer Mother    Heart disease Father    Asthma Father    Other Father 66       enlarged prostate   Asthma Brother    Hypertension Other    Heart disease Other    Heart attack Other    CVA Other    Breast cancer Maternal Aunt    Prostate cancer Paternal Uncle    Cancer Maternal Grandmother        type unknown   Lung cancer Maternal Grandfather    Cancer Paternal Grandmother        cancer in lymph nodes in arm   Prostate cancer Paternal Grandfather    Colon cancer Neg Hx    Pancreatic cancer Neg Hx     Social History:  reports that he has been smoking cigars and cigarettes. He quit smokeless tobacco use about 37 years ago.  His smokeless tobacco use included chew. He reports current alcohol use. He reports that he does not use drugs.  ROS: A complete review of systems was  performed.  All systems are negative except for pertinent findings as noted.  Physical Exam:  Vital signs in last 24 hours:   Constitutional:  Alert and oriented, No acute distress Cardiovascular: Regular rate and rhythm Respiratory: Normal respiratory effort, Lungs clear bilaterally GI: Abdomen is soft, nontender, nondistended, no abdominal masses GU: No CVA tenderness Lymphatic: No lymphadenopathy Neurologic: Grossly intact, no focal deficits Psychiatric: Normal mood and affect  Laboratory Data:  No results for input(s): WBC, HGB, HCT, PLT in the last 72 hours.  No results for input(s): NA, K, CL, GLUCOSE, Weeks, CALCIUM, CREATININE in the last 72 hours.  Invalid input(s): CO3   No results found for this or any previous visit (from the past 24 hour(s)). No results found for this or any previous visit (from the past 240 hour(s)).  Renal Function: Recent Labs    02/02/21 1020  CREATININE 0.86   Estimated Creatinine Clearance: 125.2 mL/min (by C-G formula based on SCr of 0.86 mg/dL).  Radiologic Imaging: No results found.  I independently reviewed the above imaging studies.  Assessment and Plan James Weeks is a 60 y.o. male with prostate cancer here for brachytherapy. His insurance did not approve Space OAR and he elects to proceed without Space OAR. He understands slight increase risk of rectal fistula.    Brachytherapy/space OAR consent- The patient was counseled about the natural history of prostate cancer and the standard treatment options that are available for prostate cancer. It was explained to him how his age and life expectancy, clinical stage, Gleason score, and PSA affect his prognosis, the decision to proceed with additional staging studies, as well as how that information influences recommended treatment strategies. We discussed the roles for active surveillance, radiation therapy, surgical therapy, androgen deprivation, as well as ablative therapy options  for the treatment of prostate cancer as appropriate to his individual cancer situation. We discussed the risks and benefits of these options with regard to their impact on cancer control and  also in terms of potential adverse events, complications, and impact on quality of life particularly related to urinary and sexual function. The patient was encouraged to ask questions throughout the discussion today and all questions were answered to his stated satisfaction. In addition, the patient was provided with and/or directed to appropriate resources and literature for further education about prostate cancer and treatment options.   The patient has decided to proceed with brachytherapy and SpaceOAR placement as primary treatment of his intermediate risk prostate cancer. The risks, benefits and alternatives of the aforementioned procedures was discussed in detail. Risks include, but are not limited to worsening LUTS, erectile dysfunction, rectal irritation, urethral stricture formation, fistula formation, cancer recurrence, MI, CVA, PE, DVT and the inherent risk of general anesthesia. He voices understanding and wishes to proceed.   Matt R. Rivers Hamrick MD 02/06/2021, 11:17 AM  [facility] Pager: [phone]): [phone]

## 2021-02-06 NOTE — Anesthesia Procedure Notes (Signed)
Procedure Name: LMA Insertion Date/Time: 02/06/2021 12:54 PM Performed by: Phylicia Mcgaugh D, CRNA Pre-anesthesia Checklist: Patient identified, Emergency Drugs available, Suction available and Patient being monitored Patient Re-evaluated:Patient Re-evaluated prior to induction Oxygen Delivery Method: Circle system utilized Preoxygenation: Pre-oxygenation with 100% oxygen Induction Type: IV induction Ventilation: Mask ventilation without difficulty LMA: LMA inserted LMA Size: 4.5 Tube type: Oral Number of attempts: 1 Placement Confirmation: positive ETCO2 and breath sounds checked- equal and bilateral Tube secured with: Tape Dental Injury: Teeth and Oropharynx as per pre-operative assessment

## 2021-02-07 ENCOUNTER — Encounter (HOSPITAL_BASED_OUTPATIENT_CLINIC_OR_DEPARTMENT_OTHER): Payer: Self-pay | Admitting: Urology

## 2021-02-07 NOTE — Anesthesia Postprocedure Evaluation (Signed)
Anesthesia Post Note  Patient: James Weeks  Procedure(s) Performed: RADIOACTIVE SEED IMPLANT/BRACHYTHERAPY IMPLANT WITH CYSTOSCOPY (Prostate)     Patient location during evaluation: PACU Anesthesia Type: General Level of consciousness: awake and alert Pain management: pain level controlled Vital Signs Assessment: post-procedure vital signs reviewed and stable Respiratory status: spontaneous breathing, nonlabored ventilation and respiratory function stable Cardiovascular status: stable and blood pressure returned to baseline Anesthetic complications: no   No notable events documented.  Last Vitals:  Vitals:   02/06/21 1445 02/06/21 1516  BP: (!) 148/99 (!) 148/90  Pulse: 79 74  Resp: 13 14  Temp:  36.4 C  SpO2: 94% 98%    Last Pain:  Vitals:   02/07/21 0933  TempSrc:   PainSc: 0-No pain                 Audry Pili

## 2021-02-17 ENCOUNTER — Telehealth: Payer: Self-pay | Admitting: *Deleted

## 2021-02-17 NOTE — Telephone Encounter (Signed)
CALLED PATIENT TO ASK ABOUT SWITCHING POST SEED APPTS. TO 03-03-21, SPOKE WITH PATIENT AND HE AGREED TO DO SO.

## 2021-02-20 DIAGNOSIS — C61 Malignant neoplasm of prostate: Secondary | ICD-10-CM | POA: Diagnosis not present

## 2021-02-20 DIAGNOSIS — R351 Nocturia: Secondary | ICD-10-CM | POA: Diagnosis not present

## 2021-02-22 DIAGNOSIS — I10 Essential (primary) hypertension: Secondary | ICD-10-CM | POA: Diagnosis not present

## 2021-02-22 DIAGNOSIS — Z1331 Encounter for screening for depression: Secondary | ICD-10-CM | POA: Diagnosis not present

## 2021-02-22 DIAGNOSIS — C61 Malignant neoplasm of prostate: Secondary | ICD-10-CM | POA: Diagnosis not present

## 2021-02-22 DIAGNOSIS — Z79899 Other long term (current) drug therapy: Secondary | ICD-10-CM | POA: Diagnosis not present

## 2021-02-22 DIAGNOSIS — R739 Hyperglycemia, unspecified: Secondary | ICD-10-CM | POA: Diagnosis not present

## 2021-02-22 DIAGNOSIS — E785 Hyperlipidemia, unspecified: Secondary | ICD-10-CM | POA: Diagnosis not present

## 2021-02-28 ENCOUNTER — Ambulatory Visit: Payer: Self-pay | Admitting: Urology

## 2021-02-28 ENCOUNTER — Ambulatory Visit: Payer: BC Managed Care – PPO | Admitting: Radiation Oncology

## 2021-03-02 ENCOUNTER — Telehealth: Payer: Self-pay | Admitting: *Deleted

## 2021-03-02 NOTE — Telephone Encounter (Signed)
CALLED PATIENT TO REMIND OF POST SEED APPTS. FOR 03-03-21, SPOKE WITH PATIENT AND HE IS AWARE OF THESE APPTS.

## 2021-03-03 ENCOUNTER — Ambulatory Visit
Admission: RE | Admit: 2021-03-03 | Discharge: 2021-03-03 | Disposition: A | Discharge status: Home / Self Care | Payer: BC Managed Care – PPO | Source: Ambulatory Visit | Attending: Urology | Admitting: Urology

## 2021-03-03 ENCOUNTER — Encounter: Payer: Self-pay | Admitting: Urology

## 2021-03-03 ENCOUNTER — Ambulatory Visit
Admission: RE | Admit: 2021-03-03 | Discharge: 2021-03-03 | Disposition: A | Discharge status: Home / Self Care | Payer: BC Managed Care – PPO | Source: Ambulatory Visit | Attending: Radiation Oncology | Admitting: Radiation Oncology

## 2021-03-03 VITALS — BP 122/87 | HR 73 | Temp 97.7°F | Resp 18 | Ht 73.0 in | Wt 260.1 lb

## 2021-03-03 DIAGNOSIS — Z79899 Other long term (current) drug therapy: Secondary | ICD-10-CM | POA: Diagnosis not present

## 2021-03-03 DIAGNOSIS — C61 Malignant neoplasm of prostate: Secondary | ICD-10-CM | POA: Insufficient documentation

## 2021-03-03 DIAGNOSIS — Z923 Personal history of irradiation: Secondary | ICD-10-CM | POA: Diagnosis not present

## 2021-03-03 DIAGNOSIS — R5383 Other fatigue: Secondary | ICD-10-CM | POA: Insufficient documentation

## 2021-03-03 DIAGNOSIS — Z7901 Long term (current) use of anticoagulants: Secondary | ICD-10-CM | POA: Diagnosis not present

## 2021-03-03 DIAGNOSIS — R3911 Hesitancy of micturition: Secondary | ICD-10-CM | POA: Insufficient documentation

## 2021-03-03 DIAGNOSIS — R350 Frequency of micturition: Secondary | ICD-10-CM | POA: Insufficient documentation

## 2021-03-03 NOTE — Progress Notes (Signed)
Patient reports moderate fatigue, mild hematuria, nocturia x1, w/ urinary frequency/urgency, and stream weakness.  Flomax as directed. Urology follow up on 05/16/21  I-PSS Score of 24 (severe). Meaningful use complete.  BP 122/87 (BP Location: Right Leg, Patient Position: Sitting, Cuff Size: Large)   Pulse 73   Temp 97.7 F (36.5 C)   Resp 18   Ht '6\' 1"'$  (1.854 m) Comment: the Ht. of 5;10" was wrong  Wt 260 lb 2 oz (118 kg) Comment: the wt. of 294.8 was wrong.  SpO2 98%   BMI 34.32 kg/m

## 2021-03-03 NOTE — Progress Notes (Signed)
Radiation Oncology         [phone] ________________________________  Name: NOTNAMED SWOVELAND MRN: [id number]  Date: 03/03/2021  DOB: 03/26/1961  Post-Seed Follow-Up Visit Note  CC: [name], MD  [name], MD  Diagnosis:   60 y.o. gentleman with stage T1c adenocarcinoma of the prostate with a Gleason's score of 3+4 and a PSA of 5.18    ICD-10-CM   1. Malignant neoplasm of prostate ([place])  C61       Interval Since Last Radiation:  3.5 weeks 02/06/21:  Insertion of radioactive I-125 seeds into the prostate gland; 145 Gy, definitive therapy.  Narrative:  The patient returns today for routine follow-up.  He is complaining of increased urinary frequency and urinary hesitation symptoms. He filled out a questionnaire regarding urinary function today providing and overall IPSS score of 24 characterizing his symptoms as severe with weak stream, hesitancy, intermittency, urgency and feelings of incomplete bladder emptying.  He specifically denies dysuria, gross hematuria or incontinence..  His pre-implant score was 8. He denies any abdominal pain or bowel symptoms.  He has noticed some midday fatigue but feels that this is tolerable.  Overall, he is quite pleased with his progress to date.  ALLERGIES:  has No Known Allergies.  Meds: Current Outpatient Medications  Medication Sig Dispense Refill   amLODipine (NORVASC) 5 MG tablet TAKE 1 TABLET BY MOUTH EVERYDAY AT BEDTIME (Patient taking differently: Take 5 mg by mouth at bedtime. TAKE 1 TABLET BY MOUTH EVERYDAY AT BEDTIME) 90 tablet 1   apixaban (ELIQUIS) 5 MG TABS tablet Take 1 tablet (5 mg total) by mouth 2 (two) times daily. (Patient taking differently: Take 5 mg by mouth 2 (two) times daily.) 60 tablet 6   diazepam (VALIUM) 10 MG tablet Take 5 mg by mouth daily as needed for anxiety.      docusate sodium (COLACE) 100 MG capsule Take 1 capsule (100 mg total) by mouth daily as needed for up to 30 doses. 30 capsule 0   EPINEPHrine  0.3 mg/0.3 mL IJ SOAJ injection Inject 1 mg into the muscle as needed for anaphylaxis. 2 each 1   fluticasone (FLONASE) 50 MCG/ACT nasal spray Place 50 mcg into the nose daily as needed.     loratadine (CLARITIN) 10 MG tablet Take 10 mg by mouth daily.     metoprolol succinate (TOPROL-XL) 100 MG 24 hr tablet Take 100 mg by mouth at bedtime.      oxyCODONE-acetaminophen (PERCOCET) 5-325 MG tablet Take 1 tablet by mouth every 4 (four) hours as needed for up to 12 doses for severe pain. 12 tablet 0   rosuvastatin (CRESTOR) 10 MG tablet Take 1 tablet (10 mg total) by mouth daily. (Patient taking differently: Take 10 mg by mouth daily.) 90 tablet 1   tamsulosin (FLOMAX) 0.4 MG CAPS capsule Take 0.4 mg by mouth at bedtime.     No current facility-administered medications for this encounter.    Physical Findings: In general this is a well appearing Caucasian male in no acute distress. He's alert and oriented x4 and appropriate throughout the examination. Cardiopulmonary assessment is negative for acute distress and he exhibits normal effort.   Lab Findings: Lab Results  Component Value Date   WBC 6.7 02/02/2021   HGB 14.8 02/02/2021   HCT 43.3 02/02/2021   MCV 86.3 02/02/2021   PLT 206 02/02/2021    Radiographic Findings:  Patient underwent CT imaging in our clinic for post implant dosimetry. The CT  will be reviewed by Dr. Tammi Klippel to confirm there is an adequate distribution of radioactive seeds throughout the prostate gland and ensure that there are no seeds in or near the rectum.  We suspect the final radiation plan and dosimetry will show appropriate coverage of the prostate gland. He understands that we will call and inform him of any unexpected findings on further review of his imaging and dosimetry.  Impression/Plan: 60 y.o. gentleman with stage T1c adenocarcinoma of the prostate with a Gleason's score of 3+4 and a PSA of 5.18 The patient is recovering from the effects of radiation. His  urinary symptoms should gradually improve over the next 4-6 months. We talked about this today.  He is continue taking Flomax daily as prescribed.  He is encouraged by his improvement already and is otherwise pleased with his outcome. We also talked about long-term follow-up for prostate cancer following seed implant. He understands that ongoing PSA determinations and digital rectal exams will help perform surveillance to rule out disease recurrence. He has a follow up appointment scheduled for labs to check his first posttreatment PSA on 05/09/2021 and will see Dr. Abner Greenspan the following week. He understands what to expect with his PSA measures. Patient was also educated today about some of the long-term effects from radiation including a small risk for rectal bleeding and possibly erectile dysfunction. We talked about some of the general management approaches to these potential complications. However, I did encourage the patient to contact our office or return at any point if he has questions or concerns related to his previous radiation and prostate cancer.    Nicholos Johns, PA-C

## 2021-03-03 NOTE — Progress Notes (Signed)
  Radiation Oncology         [phone] ________________________________  Name: James Weeks MRN: [id number]  Date: 03/03/2021  DOB: Jul 16, 1960  COMPLEX SIMULATION NOTE  NARRATIVE:  The patient was brought to the [facility] today following prostate seed implantation approximately one month ago.  Identity was confirmed.  All relevant records and images related to the planned course of therapy were reviewed.  Then, the patient was set-up supine.  CT images were obtained.  The CT images were loaded into the planning software.  Then the prostate and rectum were contoured.  Treatment planning then occurred.  The implanted iodine 125 seeds were identified by the physics staff for projection of radiation distribution  I have requested : 3D Simulation  I have requested a DVH of the following structures: Prostate and rectum.    ________________________________  Sheral Apley Tammi Klippel, M.D.

## 2021-03-09 ENCOUNTER — Other Ambulatory Visit: Payer: Self-pay | Admitting: Urology

## 2021-03-09 DIAGNOSIS — C61 Malignant neoplasm of prostate: Secondary | ICD-10-CM

## 2021-03-14 ENCOUNTER — Other Ambulatory Visit: Payer: Self-pay

## 2021-03-14 MED ORDER — AMLODIPINE BESYLATE 5 MG PO TABS: ORAL_TABLET | ORAL | 1 refills | Status: DC

## 2021-04-03 ENCOUNTER — Ambulatory Visit
Admission: RE | Admit: 2021-04-03 | Discharge: 2021-04-03 | Disposition: A | Discharge status: Home / Self Care | Payer: BC Managed Care – PPO | Source: Ambulatory Visit | Attending: Radiation Oncology | Admitting: Radiation Oncology

## 2021-04-03 ENCOUNTER — Telehealth: Payer: Self-pay | Admitting: Adult Health

## 2021-04-03 ENCOUNTER — Telehealth: Payer: Self-pay | Admitting: *Deleted

## 2021-04-03 ENCOUNTER — Encounter: Payer: Self-pay | Admitting: Radiation Oncology

## 2021-04-03 DIAGNOSIS — C61 Malignant neoplasm of prostate: Secondary | ICD-10-CM | POA: Diagnosis not present

## 2021-04-03 NOTE — Telephone Encounter (Signed)
Scheduled per 10/10 sch msg, msg was left on pts voicemail

## 2021-04-03 NOTE — Telephone Encounter (Signed)
Called patient and discussed SCP visit in detail.  He would like to receive phone SCP.  Schedule message sent.    Wilber Bihari, NP

## 2021-04-04 NOTE — Progress Notes (Signed)
  Radiation Oncology         ([phone]) [phone] ________________________________  Name: [name] MRN: [id number]  Date: 04/03/2021  DOB: 11/12/1960  3D Planning Note   Prostate Brachytherapy Post-Implant Dosimetry  Diagnosis: 60 y.o. gentleman with stage T1c adenocarcinoma of the prostate with a Gleason's score of 3+4 and a PSA of 5.18  Narrative: On a previous date, James Weeks returned following prostate seed implantation for post implant planning. He underwent CT scan complex simulation to delineate the three-dimensional structures of the pelvis and demonstrate the radiation distribution.  Since that time, the seed localization, and complex isodose planning with dose volume histograms have now been completed.  Results:   Prostate Coverage - The dose of radiation delivered to the 90% or more of the prostate gland (D90) was 97.06% of the prescription dose. This exceeds our goal of greater than 90%. Rectal Sparing - The volume of rectal tissue receiving the prescription dose or higher was 0.01 cc. This falls under our thresholds tolerance of 1.0 cc.  Impression: The prostate seed implant appears to show adequate target coverage and appropriate rectal sparing.  Plan:  The patient will continue to follow with urology for ongoing PSA determinations. I would anticipate a high likelihood for local tumor control with minimal risk for rectal morbidity.  ________________________________  Sheral Apley Tammi Klippel, M.D.

## 2021-04-22 IMAGING — CT CT CHEST W/O CM
2 of 4 series · 15 of 36 positions shown, 18 images · non-contrast
Comparison: CT chest 09/07/2019.  Cardiac CT 09/05/2018.

CLINICAL DATA: Follow up pulmonary nodules. No given history of
malignancy.

EXAM:
CT CHEST WITHOUT CONTRAST
TECHNIQUE: Multidetector CT imaging of the chest was performed following the
standard protocol without IV contrast.

[Series 2: thorax · axial · 0.89mm/px · z∈[+1353,+1589]mm · 12 of 140 slices shown, 15 images]
[im 11/140  mediastinal]
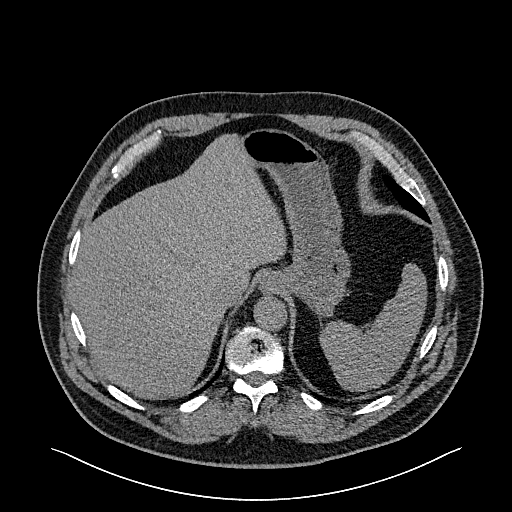
[im 11/140  lung]
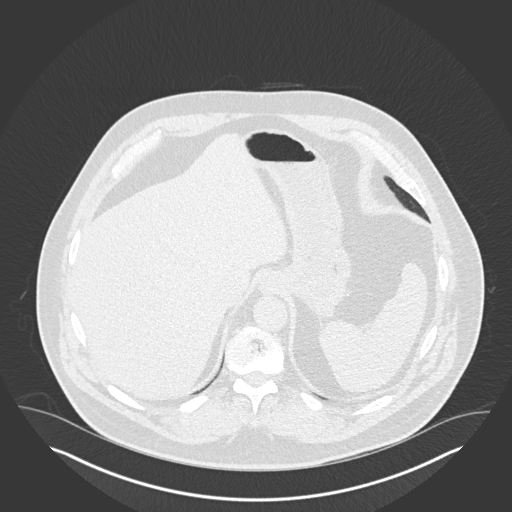
[im 22/140  lung]
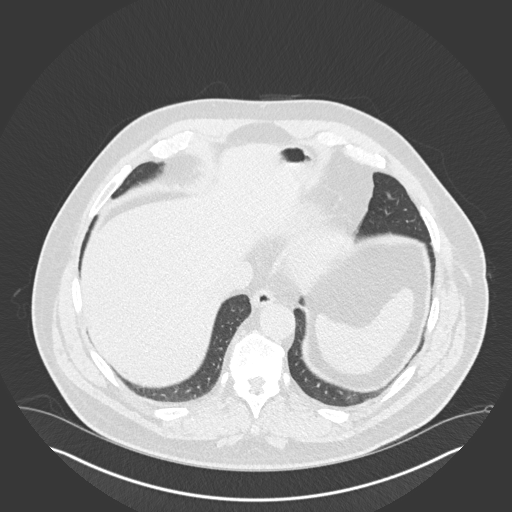
[im 33/140  lung]
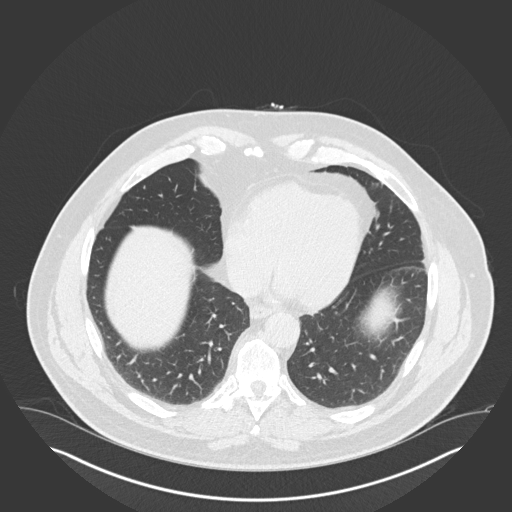
[im 43/140  lung]
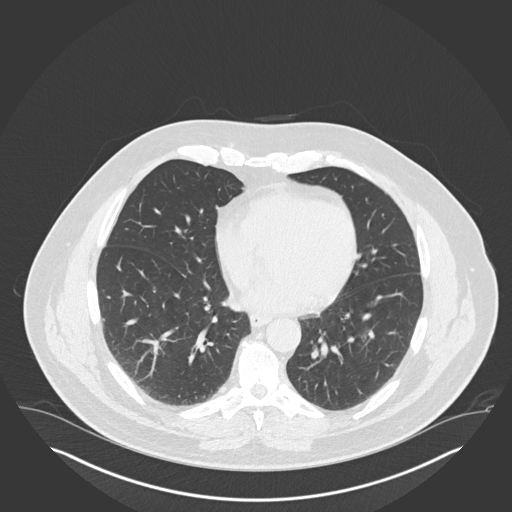
[im 54/140  mediastinal]
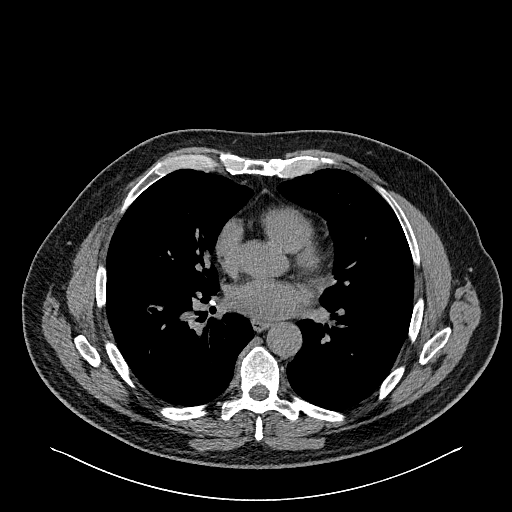
[im 54/140  lung]
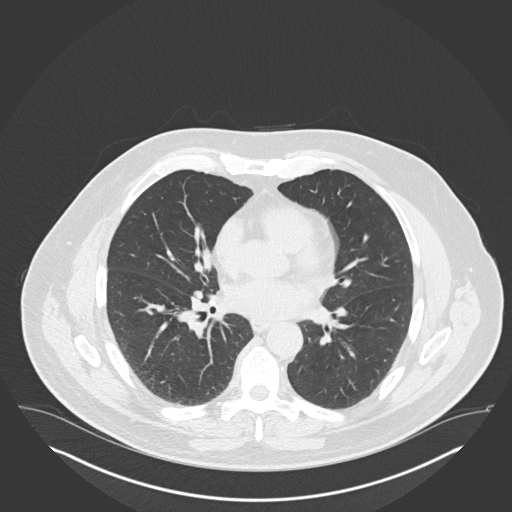
[im 65/140  lung]
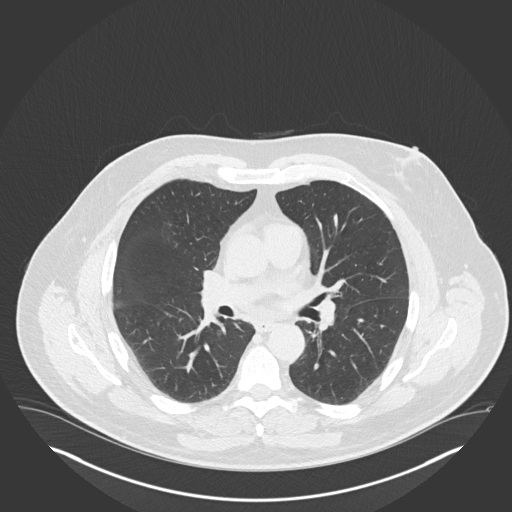
[im 75/140  lung]
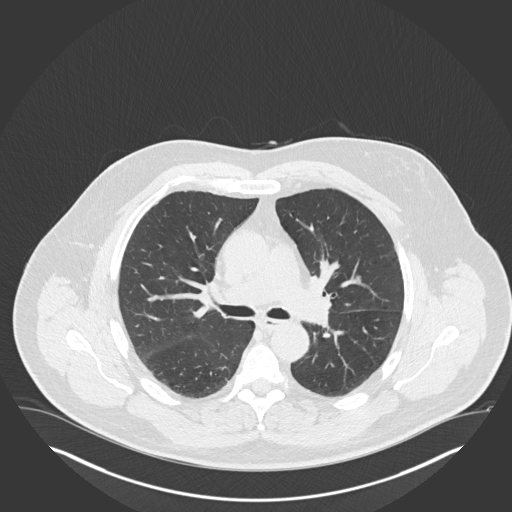
[im 86/140  lung]
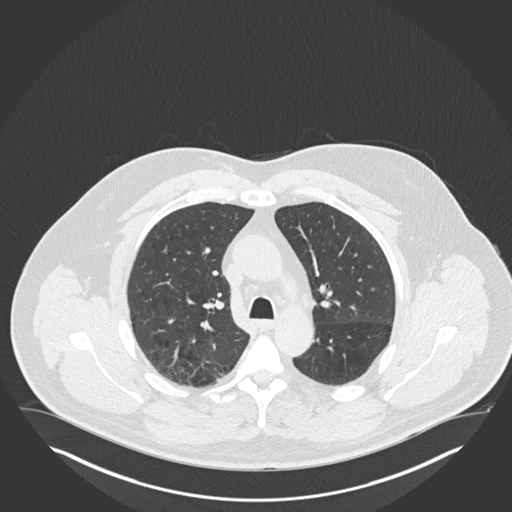
[im 97/140  mediastinal]
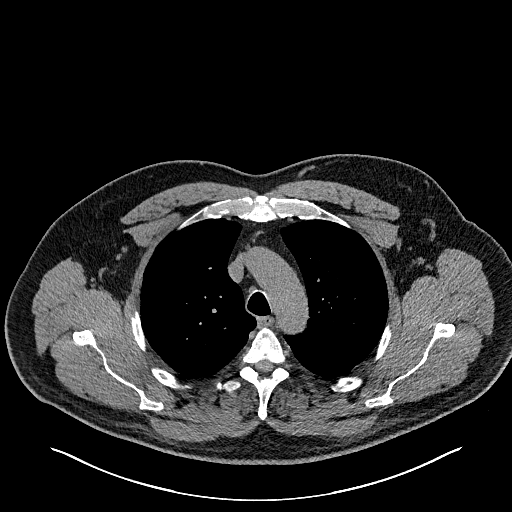
[im 97/140  lung]
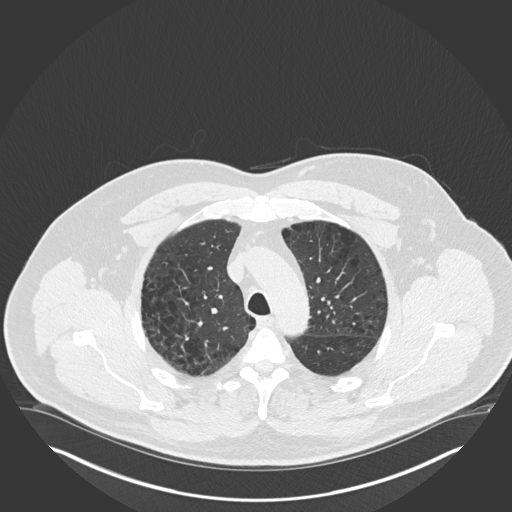
[im 107/140  lung]
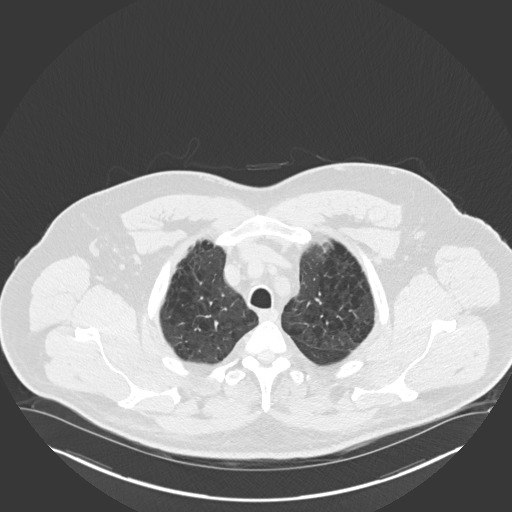
[im 118/140  lung]
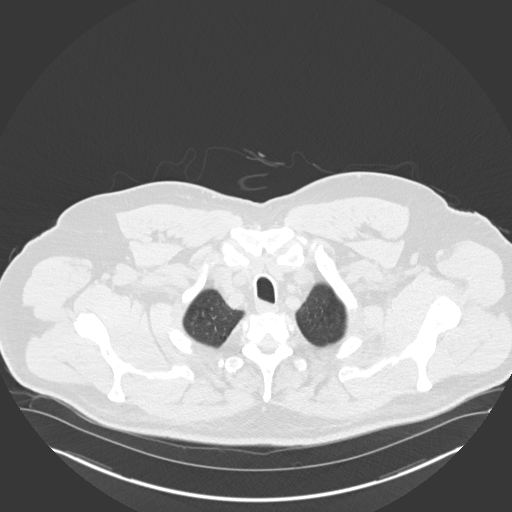
[im 129/140  lung]
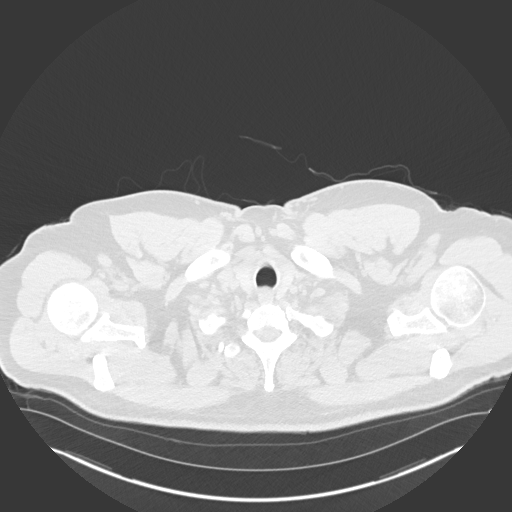

[Series 5: coronal · coronal · 0.59mm/px · 3 of 143 slices shown]
[im 29/143  lung]
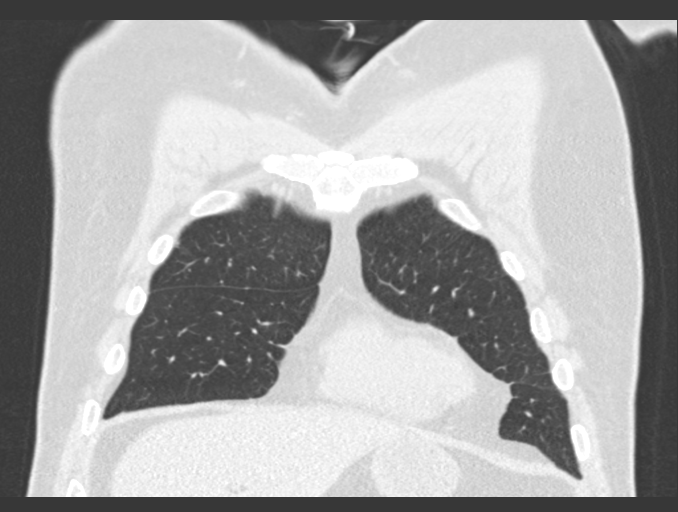
[im 57/143  lung]
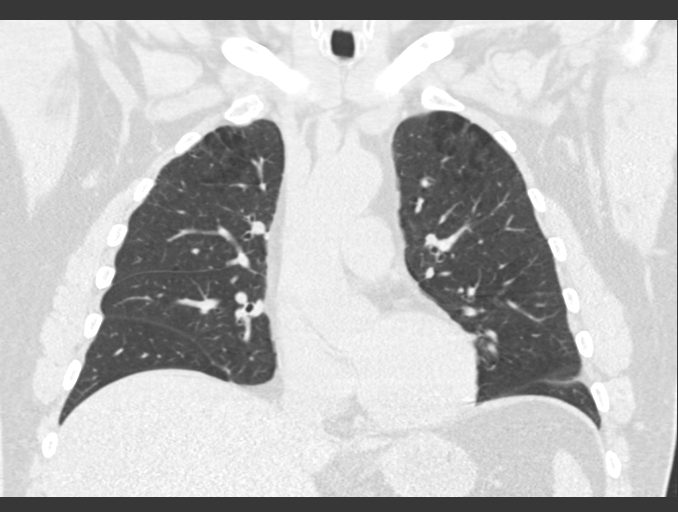
[im 86/143  lung]
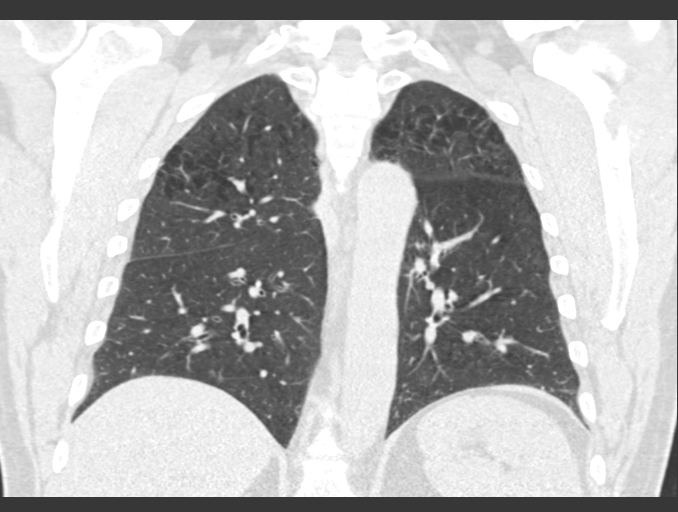

[15 of 36 positions shown; findings below may reference images not displayed]

FINDINGS: Cardiovascular: Mild aortic and coronary artery atherosclerosis.
Probable calcifications of the aortic valve. The heart size is
normal. There is no pericardial effusion.

Mediastinum/Nodes: There are no enlarged mediastinal, hilar or
axillary lymph nodes.Small calcified and noncalcified mediastinal
and right hilar lymph nodes are again noted. The thyroid gland,
trachea and esophagus demonstrate no significant findings.

Lungs/Pleura: There is no pleural effusion. Scattered small
noncalcified pulmonary nodules bilaterally are unchanged, largest in
the left upper lobe measuring 5 mm on image 74/3. There are
calcified right lung granulomas. No new or enlarging pulmonary
nodules. Underlying mild to moderate upper lobe predominant
centrilobular emphysema.

Upper abdomen: The visualized upper abdomen appears stable without
significant findings.

Musculoskeletal/Chest wall: There is no chest wall mass or
suspicious osseous finding. Mild thoracic spine degenerative
changes.
IMPRESSION: 1. Stable small pulmonary nodules bilaterally, consistent with
benign findings. Per consensus guidelines, these require no
additional follow-up. This recommendation follows the consensus
statement: Guidelines for Management of Small Pulmonary Nodules
Detected on CT Images: From the [HOSPITAL] 2145; Radiology
2. Sequela of prior granulomatous disease.
3. Aortic Atherosclerosis (DUAQ9-TE0.0) and Emphysema (DUAQ9-GYA.7).

## 2021-04-27 ENCOUNTER — Encounter: Payer: Self-pay | Admitting: *Deleted

## 2021-04-27 ENCOUNTER — Inpatient Hospital Stay: Payer: BC Managed Care – PPO | Attending: Adult Health | Admitting: *Deleted

## 2021-04-27 DIAGNOSIS — C61 Malignant neoplasm of prostate: Secondary | ICD-10-CM

## 2021-04-27 NOTE — Progress Notes (Signed)
2 Identifiers used for verification purposes for this patient visit. SCP reviewed and completed. SDOH assessed and completed.  Overall pt said, '' I feel like  I'm doing well". Pt stated in the past 2 weeks he has seen improvement with urine urgency and is only getting up at nighttime 1-2 times 5 nights out of the week as compared to before. Pt still has a weak stream he says. Pt will see PCP for his annual physical this month. Pt denies pain and states he has normal soft-formed stools  daily. Last colonoscopy was 10 years ago. I told him to mention this to his PCP at appt so he can get scheduled. Pt has already seen his dermatologist for the year. No issues.  Pt does not get the flu vaccine nor Covid vaccines. He is planning to get the shingles vaccine. No barriers, no needs noted at the present time.

## 2021-05-09 DIAGNOSIS — C61 Malignant neoplasm of prostate: Secondary | ICD-10-CM | POA: Diagnosis not present

## 2021-05-16 DIAGNOSIS — C61 Malignant neoplasm of prostate: Secondary | ICD-10-CM | POA: Diagnosis not present

## 2021-05-16 DIAGNOSIS — R350 Frequency of micturition: Secondary | ICD-10-CM | POA: Diagnosis not present

## 2021-05-26 ENCOUNTER — Telehealth: Payer: Self-pay | Admitting: Cardiology

## 2021-05-26 MED ORDER — APIXABAN 5 MG PO TABS: 5.0000 mg | ORAL_TABLET | Freq: Two times a day (BID) | ORAL | 1 refills | Status: DC

## 2021-05-26 NOTE — Telephone Encounter (Signed)
*  STAT* If patient is at the pharmacy, call can be transferred to refill team.   1. Which medications need to be refilled? (please list name of each medication and dose if known) apixaban (ELIQUIS) 5 MG TABS tablet  2. Which pharmacy/location (including street and city if local pharmacy) is medication to be sent to? [id number] - [place], [place] - [address]  3. Do they need a 30 day or 90 day supply? [place]

## 2021-05-26 NOTE — Telephone Encounter (Signed)
Prescription refill request for Eliquis received. Indication: Afib  Last office visit: 09/15/20 Johney Frame)  Scr: 0.86 (02/02/21) Age: 60 Weight: 118kg  Appropriate dose and refill sent to requested pharmacy.

## 2021-06-29 DIAGNOSIS — D2239 Melanocytic nevi of other parts of face: Secondary | ICD-10-CM | POA: Diagnosis not present

## 2021-06-29 DIAGNOSIS — L905 Scar conditions and fibrosis of skin: Secondary | ICD-10-CM | POA: Diagnosis not present

## 2021-06-29 DIAGNOSIS — D225 Melanocytic nevi of trunk: Secondary | ICD-10-CM | POA: Diagnosis not present

## 2021-06-29 DIAGNOSIS — D2262 Melanocytic nevi of left upper limb, including shoulder: Secondary | ICD-10-CM | POA: Diagnosis not present

## 2021-07-11 ENCOUNTER — Other Ambulatory Visit: Payer: Self-pay | Admitting: *Deleted

## 2021-07-11 MED ORDER — ROSUVASTATIN CALCIUM 10 MG PO TABS: 10.0000 mg | ORAL_TABLET | Freq: Every day | ORAL | 1 refills | Status: DC

## 2021-08-07 DIAGNOSIS — C61 Malignant neoplasm of prostate: Secondary | ICD-10-CM | POA: Diagnosis not present

## 2021-08-09 ENCOUNTER — Other Ambulatory Visit: Payer: Self-pay

## 2021-08-09 DIAGNOSIS — J329 Chronic sinusitis, unspecified: Secondary | ICD-10-CM | POA: Diagnosis not present

## 2021-08-09 DIAGNOSIS — Z6835 Body mass index (BMI) 35.0-35.9, adult: Secondary | ICD-10-CM | POA: Diagnosis not present

## 2021-08-09 DIAGNOSIS — F419 Anxiety disorder, unspecified: Secondary | ICD-10-CM | POA: Diagnosis not present

## 2021-08-09 DIAGNOSIS — I4891 Unspecified atrial fibrillation: Secondary | ICD-10-CM | POA: Diagnosis not present

## 2021-08-09 DIAGNOSIS — Z79899 Other long term (current) drug therapy: Secondary | ICD-10-CM | POA: Diagnosis not present

## 2021-08-09 MED ORDER — METOPROLOL SUCCINATE ER 100 MG PO TB24: 100.0000 mg | ORAL_TABLET | Freq: Every day | ORAL | 0 refills | Status: DC

## 2021-08-09 NOTE — Telephone Encounter (Signed)
Pt's medication was sent to pt's pharmacy as requested. Confirmation received.  °

## 2021-08-22 ENCOUNTER — Telehealth: Payer: Self-pay | Admitting: *Deleted

## 2021-08-22 ENCOUNTER — Other Ambulatory Visit: Payer: Self-pay | Admitting: *Deleted

## 2021-08-22 NOTE — Telephone Encounter (Signed)
error 

## 2021-08-23 DIAGNOSIS — R350 Frequency of micturition: Secondary | ICD-10-CM | POA: Diagnosis not present

## 2021-08-23 DIAGNOSIS — C61 Malignant neoplasm of prostate: Secondary | ICD-10-CM | POA: Diagnosis not present

## 2021-08-28 DIAGNOSIS — R739 Hyperglycemia, unspecified: Secondary | ICD-10-CM | POA: Diagnosis not present

## 2021-08-28 DIAGNOSIS — I10 Essential (primary) hypertension: Secondary | ICD-10-CM | POA: Diagnosis not present

## 2021-08-28 DIAGNOSIS — C61 Malignant neoplasm of prostate: Secondary | ICD-10-CM | POA: Diagnosis not present

## 2021-08-28 DIAGNOSIS — E785 Hyperlipidemia, unspecified: Secondary | ICD-10-CM | POA: Diagnosis not present

## 2021-09-06 ENCOUNTER — Other Ambulatory Visit: Payer: Self-pay | Admitting: *Deleted

## 2021-09-06 MED ORDER — AMLODIPINE BESYLATE 5 MG PO TABS: ORAL_TABLET | ORAL | 1 refills | Status: DC

## 2021-09-11 NOTE — Progress Notes (Deleted)
?Cardiology Office Note:   ? ?Date:  09/11/2021  ? ?ID:  James Weeks, DOB [birth year], MRN [id number] ? ?PCP:  [name], MD ?  ?[place]  ?Cardiologist:  [name], MD  ?Advanced Practice Provider:  [name] to display ?Electrophysiologist:  None  :[id number]}  ? ?Referring MD: [name], MD  ? ? ?History of Present Illness:   ? ?James Weeks is a 61 y.o. male with a hx of HTN, HLD, elevated PSA, atrial fibrillation and anxiety who was previously followed by [name] now returning to clinic for follow-up.  ? ?Patient was followed by [name] over the past several years. Had a spell of presyncope about 10 years prior with negative stress test and echo at that time . BP very high and he was started on antihypertensive regimen. He has had his HCTZ stopped due to gynecomastia. He self referred himself here for DOE and chest tightness. BP still not controlled. Norvasc was added. Coronary CT obtained in 2020 which showed no significant CAD and coronary calcium score of 1. Was seen in 08/2019 where he was noted to be in new onset afib. He was asymptomatic at that time and started on apixaban. He underwent DCCV in 10/2019 with return to NSR. TTE with LVEF 50-55%, mild LAE, no significant valve disease. ? ?Last seen 08/2020 where he was doing well. No CV symptoms at that time.  ? ?Today, *** ? ?Past Medical History:  ?Diagnosis Date  ? Allergic rhinitis due to pollen   ? Anticoagulant long-term use   ? eliquis-- managed by cardiology  ? Anxiety   ? Emphysema lung ([place])   ? per chest CT in epic 09-29-2020  ? Hyperlipidemia   ? Hypertension   ? Mild CAD   ? cardiologist--- [name]---  per coronary CT 09-05-2018 mild cad involving midLAD, moderate torturous coronary arteries, calcium score =1  ? PAF (paroxysmal atrial fibrillation) ([place])   ? cardiologist-- [name]-- dx 03/ 2021 asymptomatic  ? Prostate cancer ([place])   ? urologist--- dr gay--- dx 04/  2020 Gleason 3+3 active survillence;  bx 12/ 2020 ,Gleason3+4 survillence;  MRI fusion bx 04/ 2022 Gleason 3+4  ? Pulmonary nodules   ? last chest CT in epic 09-29-2020 bilateral upper lobe nodules stable, benign  ? ? ?Past Surgical History:  ?Procedure Laterality Date  ? CARDIOVERSION N/A 10/30/2019  ? Procedure: CARDIOVERSION;  Surgeon: [name], MD;  Location: [facility];  Service: Cardiovascular;  Laterality: N/A;  ? COLONOSCOPY WITH PROPOFOL  2012  ? PROSTATE BIOPSY    ? RADIOACTIVE SEED IMPLANT N/A 02/06/2021  ? Procedure: RADIOACTIVE SEED IMPLANT/BRACHYTHERAPY IMPLANT WITH CYSTOSCOPY;  Surgeon: [name], MD;  Location: [facility];  Service: Urology;  Laterality: N/A;  74 seeds; No seeds detected in bladder per Dr. Abner Greenspan  ? ? ?Current Medications: ?No outpatient medications have been marked as taking for the 09/13/21 encounter (Appointment) with [name], MD.  ?  ? ?Allergies:   Patient has no known allergies.  ? ?Social History  ? ?Socioeconomic History  ? Marital status: Married  ?  Spouse name: Not on file  ? Number of children: 2  ? Years of education: Not on file  ? Highest education level: Not on file  ?Occupational History  ? Not on file  ?Tobacco Use  ? Smoking status: Some Days  ?  Years: 20.00  ?  Types: Cigars, Cigarettes  ?  Smokeless tobacco: Former  ?  Types: Chew  ?  Quit date: 70  ? Tobacco comments:  ?  02-02-2021  pt quit cigarette smoking 2012, has continued occasional cigar smoking  ?Vaping Use  ? Vaping Use: Never used  ?Substance and Sexual Activity  ? Alcohol use: Yes  ?  Comment: occasionally  ? Drug use: Never  ? Sexual activity: Yes  ?Other Topics Concern  ? Not on file  ?Social History Narrative  ? Not on file  ? ?Social Determinants of Health  ? ?Financial Resource Strain: Low Risk   ? Difficulty of Paying Living Expenses: Not hard at all  ?Food Insecurity: No Food Insecurity  ? Worried About Charity fundraiser in the Last Year: Never true  ?  Ran Out of Food in the Last Year: Never true  ?Transportation Needs: No Transportation Needs  ? Lack of Transportation (Medical): No  ? Lack of Transportation (Non-Medical): No  ?Physical Activity: Sufficiently Active  ? Days of Exercise per Week: 5 days  ? Minutes of Exercise per Session: 60 min  ?Stress: No Stress Concern Present  ? Feeling of Stress : Only a little  ?Social Connections: Moderately Isolated  ? Frequency of Communication with Friends and Family: More than three times a week  ? Frequency of Social Gatherings with Friends and Family: Twice a week  ? Attends Religious Services: Never  ? Active Member of Clubs or Organizations: No  ? Attends Archivist Meetings: Never  ? Marital Status: Married  ?  ? ?Family History: ?The patient's family history includes Asthma in his brother and father; Breast cancer in his maternal aunt and mother; CVA in an other family member; Cancer in his maternal grandmother and paternal grandmother; Diabetes in his mother; Heart attack in an other family member; Heart disease in his father and another family member; Hypertension in his mother and another family member; Lung cancer in his maternal grandfather; Other (age of onset: 62) in his father; Prostate cancer in his paternal grandfather and paternal uncle. There is no history of Colon cancer or Pancreatic cancer. ? ?ROS:   ?Please see the history of present illness.    ?Review of Systems  ?Constitutional:  Negative for chills and fever.  ?HENT:  Negative for hearing loss and sore throat.   ?Eyes:  Negative for blurred vision and redness.  ?Respiratory:  Negative for shortness of breath.   ?Cardiovascular:  Negative for chest pain, palpitations, orthopnea, claudication, leg swelling and PND.  ?Gastrointestinal:  Negative for blood in stool, melena, nausea and vomiting.  ?Genitourinary:  Negative for hematuria.  ?Musculoskeletal:  Negative for falls.  ?Neurological:  Negative for dizziness and loss of  consciousness.  ?Endo/Heme/Allergies:  Negative for polydipsia.  ?Psychiatric/Behavioral:  Negative for substance abuse.   ? ?EKGs/Labs/Other Studies Reviewed:   ? ?The following studies were reviewed today: ?Coronary CTA 09/05/18: ?FINDINGS: ?Coronary calcium score: The patient's coronary artery calcium score ?is 1, which places the patient in the 35 percentile. ?  ?Coronary arteries: Normal coronary origins.  Right dominance. ?  ?Moderately tortuous coronary arteries. ?  ?Right Coronary Artery: No detectable plaque or stenosis. ?  ?Left Main Coronary Artery: No detectable plaque or stenosis. ?  ?Left Anterior Descending Coronary Artery: Mild mixed atherosclerotic ?plaque in the mid LAD (25-49% stenosis), otherwise no significant ?stenosis. ?  ?Left Circumflex Artery: No detectable plaque or stenosis. ?  ?Aorta: Normal size, 36 mm at mid ascending aorta (level of PA ?bifurcation, double  oblique measurement). No calcifications. No ?dissection. ?  ?Aortic Valve: No calcifications. ?  ?Other findings: ?  ?Normal pulmonary vein drainage into the left atrium. ?  ?Normal left atrial appendage without a thrombus. ?  ?Borderline enlargement of main pulmonary artery, approximately 30 ?mm. ?  ?IMPRESSION: ?1. Mild CAD of mid LAD, CADRADS = 2. Moderately tortuous coronary ?arteries. ?  ?2. The patient's coronary artery calcium score is 1, which places ?the patient in the 75 percentile for age and sex matched control. ?  ?3.  Normal coronary origin with right dominance. ? ?TTE 09/23/19: ?IMPRESSIONS  ? 1. Left ventricular ejection fraction, by estimation, is 50 to 55%. The  ?left ventricle has low normal function. The left ventricle has no regional  ?wall motion abnormalities. Left ventricular diastolic function could not  ?be evaluated.  ? 2. Right ventricular systolic function is normal. The right ventricular  ?size is normal. The estimated right ventricular systolic pressure is 29.5  ?mmHg.  ? 3. Left atrial size was mildly  dilated.  ? 4. The mitral valve is normal in structure. Trivial mitral valve  ?regurgitation. No evidence of mitral stenosis.  ? 5. The aortic valve is normal in structure. Aortic valve regurgitation is  ?not visua

## 2021-09-12 NOTE — H&P (View-Only) (Signed)
?Cardiology Office Note:   ? ?Date:  09/13/2021  ? ?ID:  James Weeks, DOB 1960-07-04, MRN [id number] ? ?PCP:  [name], MD ?  ?[place]  ?Cardiologist:  [name], MD  ?Advanced Practice Provider:  [name] to display ?Electrophysiologist:  None  :[id number]}  ? ?Referring MD: [name], MD  ? ? ?History of Present Illness:   ? ?James Weeks is a 61 y.o. male with a hx of HTN, HLD, elevated PSA, atrial fibrillation and anxiety who was previously followed by [name] now returning to clinic for follow-up.  ? ?Patient was followed by [name] over the past several years. Had a spell of presyncope about 10 years prior with negative stress test and echo at that time . BP very high and he was started on antihypertensive regimen. He has had his HCTZ stopped due to gynecomastia. He self referred himself here for DOE and chest tightness. BP still not controlled. Norvasc was added. Coronary CT obtained in 2020 which showed no significant CAD and coronary calcium score of 1. Was seen in 08/2019 where he was noted to be in new onset afib. He was asymptomatic at that time and started on apixaban. He underwent DCCV in 10/2019 with return to NSR. TTE with LVEF 50-55%, mild LAE, no significant valve disease. ? ?Last seen 08/2020 where he was doing well. No CV symptoms at that time.  ? ?Today, he is not doing the best. He has developed recurrent Afib and has felt more fatigued and low energy. Occasional chest tightness and palpitations. HR mainly 80s at home. Has been compliant with his apixaban without missing doses. Has been taking metop as well and states this has controlled HR overall. ? ?He becomes short of breath after walking up 3 flights of stairs but is able to walk on flat ground without issues. He denies chest pain, PND, orthopnea, syncope or lightheadedness.  ? ?Past Medical History:  ?Diagnosis Date  ? Allergic rhinitis due to pollen   ?  Anticoagulant long-term use   ? eliquis-- managed by cardiology  ? Anxiety   ? Emphysema lung ([place])   ? per chest CT in epic 09-29-2020  ? Hyperlipidemia   ? Hypertension   ? Mild CAD   ? cardiologist--- [name]---  per coronary CT 09-05-2018 mild cad involving midLAD, moderate torturous coronary arteries, calcium score =1  ? PAF (paroxysmal atrial fibrillation) ([place])   ? cardiologist-- [name]-- dx 03/ 2021 asymptomatic  ? Prostate cancer ([place])   ? urologist--- dr gay--- dx 04/ 2020 Gleason 3+3 active survillence;  bx 12/ 2020 ,Gleason3+4 survillence;  MRI fusion bx 04/ 2022 Gleason 3+4  ? Pulmonary nodules   ? last chest CT in epic 09-29-2020 bilateral upper lobe nodules stable, benign  ? ? ?Past Surgical History:  ?Procedure Laterality Date  ? CARDIOVERSION N/A 10/30/2019  ? Procedure: CARDIOVERSION;  Surgeon: [name], MD;  Location: [facility];  Service: Cardiovascular;  Laterality: N/A;  ? COLONOSCOPY WITH PROPOFOL  2012  ? PROSTATE BIOPSY    ? RADIOACTIVE SEED IMPLANT N/A 02/06/2021  ? Procedure: RADIOACTIVE SEED IMPLANT/BRACHYTHERAPY IMPLANT WITH CYSTOSCOPY;  Surgeon: [name], MD;  Location: [facility];  Service: Urology;  Laterality: N/A;  74 seeds; No seeds detected in bladder per Dr. Abner Greenspan  ? ? ?Current Medications: ?Current Meds  ?Medication Sig  ? amLODipine (NORVASC) 5 MG tablet TAKE 1 TABLET BY MOUTH EVERYDAY  AT BEDTIME  ? apixaban (ELIQUIS) 5 MG TABS tablet Take 1 tablet (5 mg total) by mouth 2 (two) times daily.  ? diazepam (VALIUM) 10 MG tablet Take 5 mg by mouth daily as needed for anxiety.   ? EPINEPHrine 0.3 mg/0.3 mL IJ SOAJ injection Inject 1 mg into the muscle as needed for anaphylaxis.  ? fluticasone (FLONASE) 50 MCG/ACT nasal spray Place 50 mcg into the nose daily as needed.  ? loratadine (CLARITIN) 10 MG tablet Take 10 mg by mouth daily.  ? metoprolol succinate (TOPROL-XL) 100 MG 24 hr tablet Take 1 tablet (100 mg total) by mouth at bedtime.  ?  rosuvastatin (CRESTOR) 10 MG tablet Take 1 tablet (10 mg total) by mouth daily.  ? tamsulosin (FLOMAX) 0.4 MG CAPS capsule Take 0.4 mg by mouth at bedtime.  ?  ? ?Allergies:   Patient has no known allergies.  ? ?Social History  ? ?Socioeconomic History  ? Marital status: Married  ?  Spouse name: Not on file  ? Number of children: 2  ? Years of education: Not on file  ? Highest education level: Not on file  ?Occupational History  ? Not on file  ?Tobacco Use  ? Smoking status: Some Days  ?  Years: 20.00  ?  Types: Cigars, Cigarettes  ? Smokeless tobacco: Former  ?  Types: Chew  ?  Quit date: 66  ? Tobacco comments:  ?  02-02-2021  pt quit cigarette smoking 2012, has continued occasional cigar smoking  ?Vaping Use  ? Vaping Use: Never used  ?Substance and Sexual Activity  ? Alcohol use: Yes  ?  Comment: occasionally  ? Drug use: Never  ? Sexual activity: Yes  ?Other Topics Concern  ? Not on file  ?Social History Narrative  ? Not on file  ? ?Social Determinants of Health  ? ?Financial Resource Strain: Low Risk   ? Difficulty of Paying Living Expenses: Not hard at all  ?Food Insecurity: No Food Insecurity  ? Worried About Charity fundraiser in the Last Year: Never true  ? Ran Out of Food in the Last Year: Never true  ?Transportation Needs: No Transportation Needs  ? Lack of Transportation (Medical): No  ? Lack of Transportation (Non-Medical): No  ?Physical Activity: Sufficiently Active  ? Days of Exercise per Week: 5 days  ? Minutes of Exercise per Session: 60 min  ?Stress: No Stress Concern Present  ? Feeling of Stress : Only a little  ?Social Connections: Moderately Isolated  ? Frequency of Communication with Friends and Family: More than three times a week  ? Frequency of Social Gatherings with Friends and Family: Twice a week  ? Attends Religious Services: Never  ? Active Member of Clubs or Organizations: No  ? Attends Archivist Meetings: Never  ? Marital Status: Married  ?  ? ?Family History: ?The  patient's family history includes Asthma in his brother and father; Breast cancer in his maternal aunt and mother; CVA in an other family member; Cancer in his maternal grandmother and paternal grandmother; Diabetes in his mother; Heart attack in an other family member; Heart disease in his father and another family member; Hypertension in his mother and another family member; Lung cancer in his maternal grandfather; Other (age of onset: 18) in his father; Prostate cancer in his paternal grandfather and paternal uncle. There is no history of Colon cancer or Pancreatic cancer. ? ?ROS:   ?Please see the history of present illness.    ?  Review of Systems  ?Constitutional:  Positive for malaise/fatigue. Negative for chills and fever.  ?HENT:  Negative for hearing loss and sore throat.   ?Eyes:  Negative for blurred vision and redness.  ?Respiratory:  Positive for shortness of breath (exertional).   ?     Positive for snoring  ?Cardiovascular:  Positive for chest pain (Chest tightness) and palpitations. Negative for orthopnea, claudication, leg swelling and PND.  ?Gastrointestinal:  Negative for blood in stool, melena, nausea and vomiting.  ?Genitourinary:  Negative for hematuria.  ?Musculoskeletal:  Negative for falls.  ?Skin:  Negative for rash.  ?Neurological:  Negative for dizziness and loss of consciousness.  ?Endo/Heme/Allergies:  Negative for polydipsia.  ?Psychiatric/Behavioral:  Negative for substance abuse.   ? ?EKGs/Labs/Other Studies Reviewed:   ? ?The following studies were reviewed today: ?Chest CT 09/29/20 ?IMPRESSION: ?1. Stable small pulmonary nodules bilaterally, consistent with benign findings. Per consensus guidelines, these require no additional follow-up. This recommendation follows the consensus statement: Guidelines for Management of Small Pulmonary Nodules Detected on CT Images: From the Fleischner Society 2017; Radiology ?2017; 440:102-725. ?2. Sequela of prior granulomatous disease. ?3. Aortic  Atherosclerosis (ICD10-I70.0) and Emphysema (ICD10-J43.9). ? ?TTE 09/05/19: ?IMPRESSIONS  ? 1. Left ventricular ejection fraction, by estimation, is 50 to 55%. The left ventricle has low normal function. The left ventricle has

## 2021-09-12 NOTE — Progress Notes (Signed)
?Cardiology Office Note:   ? ?Date:  09/13/2021  ? ?ID:  James Weeks, DOB 08-15-1960, MRN [id number] ? ?PCP:  [name], MD ?  ?[place]  ?Cardiologist:  [name], MD  ?Advanced Practice Provider:  [name] to display ?Electrophysiologist:  None  :[phone]}  ? ?Referring MD: [name], MD  ? ? ?History of Present Illness:   ? ?Render James Weeks is a 61 y.o. male with a hx of HTN, HLD, elevated PSA, atrial fibrillation and anxiety who was previously followed by [name] now returning to clinic for follow-up.  ? ?Patient was followed by [name] over the past several years. Had a spell of presyncope about 10 years prior with negative stress test and echo at that time . BP very high and he was started on antihypertensive regimen. He has had his HCTZ stopped due to gynecomastia. He self referred himself here for DOE and chest tightness. BP still not controlled. Norvasc was added. Coronary CT obtained in 2020 which showed no significant CAD and coronary calcium score of 1. Was seen in 08/2019 where he was noted to be in new onset afib. He was asymptomatic at that time and started on apixaban. He underwent DCCV in 10/2019 with return to NSR. TTE with LVEF 50-55%, mild LAE, no significant valve disease. ? ?Last seen 08/2020 where he was doing well. No CV symptoms at that time.  ? ?Today, he is not doing the best. He has developed recurrent Afib and has felt more fatigued and low energy. Occasional chest tightness and palpitations. HR mainly 80s at home. Has been compliant with his apixaban without missing doses. Has been taking metop as well and states this has controlled HR overall. ? ?He becomes short of breath after walking up 3 flights of stairs but is able to walk on flat ground without issues. He denies chest pain, PND, orthopnea, syncope or lightheadedness.  ? ?Past Medical History:  ?Diagnosis Date  ? Allergic rhinitis due to pollen   ?  Anticoagulant long-term use   ? eliquis-- managed by cardiology  ? Anxiety   ? Emphysema lung ([place])   ? per chest CT in epic 09-29-2020  ? Hyperlipidemia   ? Hypertension   ? Mild CAD   ? cardiologist--- [name]---  per coronary CT 09-05-2018 mild cad involving midLAD, moderate torturous coronary arteries, calcium score =1  ? PAF (paroxysmal atrial fibrillation) ([place])   ? cardiologist-- [name]-- dx 03/ 2021 asymptomatic  ? Prostate cancer ([place])   ? urologist--- dr gay--- dx 04/ 2020 Gleason 3+3 active survillence;  bx 12/ 2020 ,Gleason3+4 survillence;  MRI fusion bx 04/ 2022 Gleason 3+4  ? Pulmonary nodules   ? last chest CT in epic 09-29-2020 bilateral upper lobe nodules stable, benign  ? ? ?Past Surgical History:  ?Procedure Laterality Date  ? CARDIOVERSION N/A 10/30/2019  ? Procedure: CARDIOVERSION;  Surgeon: [name], MD;  Location: [facility];  Service: Cardiovascular;  Laterality: N/A;  ? COLONOSCOPY WITH PROPOFOL  2012  ? PROSTATE BIOPSY    ? RADIOACTIVE SEED IMPLANT N/A 02/06/2021  ? Procedure: RADIOACTIVE SEED IMPLANT/BRACHYTHERAPY IMPLANT WITH CYSTOSCOPY;  Surgeon: [name], MD;  Location: [facility];  Service: Urology;  Laterality: N/A;  74 seeds; No seeds detected in bladder per Dr. Abner Greenspan  ? ? ?Current Medications: ?Current Meds  ?Medication Sig  ? amLODipine (NORVASC) 5 MG tablet TAKE 1 TABLET BY MOUTH EVERYDAY  AT BEDTIME  ? apixaban (ELIQUIS) 5 MG TABS tablet Take 1 tablet (5 mg total) by mouth 2 (two) times daily.  ? diazepam (VALIUM) 10 MG tablet Take 5 mg by mouth daily as needed for anxiety.   ? EPINEPHrine 0.3 mg/0.3 mL IJ SOAJ injection Inject 1 mg into the muscle as needed for anaphylaxis.  ? fluticasone (FLONASE) 50 MCG/ACT nasal spray Place 50 mcg into the nose daily as needed.  ? loratadine (CLARITIN) 10 MG tablet Take 10 mg by mouth daily.  ? metoprolol succinate (TOPROL-XL) 100 MG 24 hr tablet Take 1 tablet (100 mg total) by mouth at bedtime.  ?  rosuvastatin (CRESTOR) 10 MG tablet Take 1 tablet (10 mg total) by mouth daily.  ? tamsulosin (FLOMAX) 0.4 MG CAPS capsule Take 0.4 mg by mouth at bedtime.  ?  ? ?Allergies:   Patient has no known allergies.  ? ?Social History  ? ?Socioeconomic History  ? Marital status: Married  ?  Spouse name: Not on file  ? Number of children: 2  ? Years of education: Not on file  ? Highest education level: Not on file  ?Occupational History  ? Not on file  ?Tobacco Use  ? Smoking status: Some Days  ?  Years: 20.00  ?  Types: Cigars, Cigarettes  ? Smokeless tobacco: Former  ?  Types: Chew  ?  Quit date: 51  ? Tobacco comments:  ?  02-02-2021  pt quit cigarette smoking 2012, has continued occasional cigar smoking  ?Vaping Use  ? Vaping Use: Never used  ?Substance and Sexual Activity  ? Alcohol use: Yes  ?  Comment: occasionally  ? Drug use: Never  ? Sexual activity: Yes  ?Other Topics Concern  ? Not on file  ?Social History Narrative  ? Not on file  ? ?Social Determinants of Health  ? ?Financial Resource Strain: Low Risk   ? Difficulty of Paying Living Expenses: Not hard at all  ?Food Insecurity: No Food Insecurity  ? Worried About Charity fundraiser in the Last Year: Never true  ? Ran Out of Food in the Last Year: Never true  ?Transportation Needs: No Transportation Needs  ? Lack of Transportation (Medical): No  ? Lack of Transportation (Non-Medical): No  ?Physical Activity: Sufficiently Active  ? Days of Exercise per Week: 5 days  ? Minutes of Exercise per Session: 60 min  ?Stress: No Stress Concern Present  ? Feeling of Stress : Only a little  ?Social Connections: Moderately Isolated  ? Frequency of Communication with Friends and Family: More than three times a week  ? Frequency of Social Gatherings with Friends and Family: Twice a week  ? Attends Religious Services: Never  ? Active Member of Clubs or Organizations: No  ? Attends Archivist Meetings: Never  ? Marital Status: Married  ?  ? ?Family History: ?The  patient's family history includes Asthma in his brother and father; Breast cancer in his maternal aunt and mother; CVA in an other family member; Cancer in his maternal grandmother and paternal grandmother; Diabetes in his mother; Heart attack in an other family member; Heart disease in his father and another family member; Hypertension in his mother and another family member; Lung cancer in his maternal grandfather; Other (age of onset: 31) in his father; Prostate cancer in his paternal grandfather and paternal uncle. There is no history of Colon cancer or Pancreatic cancer. ? ?ROS:   ?Please see the history of present illness.    ?  Review of Systems  ?Constitutional:  Positive for malaise/fatigue. Negative for chills and fever.  ?HENT:  Negative for hearing loss and sore throat.   ?Eyes:  Negative for blurred vision and redness.  ?Respiratory:  Positive for shortness of breath (exertional).   ?     Positive for snoring  ?Cardiovascular:  Positive for chest pain (Chest tightness) and palpitations. Negative for orthopnea, claudication, leg swelling and PND.  ?Gastrointestinal:  Negative for blood in stool, melena, nausea and vomiting.  ?Genitourinary:  Negative for hematuria.  ?Musculoskeletal:  Negative for falls.  ?Skin:  Negative for rash.  ?Neurological:  Negative for dizziness and loss of consciousness.  ?Endo/Heme/Allergies:  Negative for polydipsia.  ?Psychiatric/Behavioral:  Negative for substance abuse.   ? ?EKGs/Labs/Other Studies Reviewed:   ? ?The following studies were reviewed today: ?Chest CT 09/29/20 ?IMPRESSION: ?1. Stable small pulmonary nodules bilaterally, consistent with benign findings. Per consensus guidelines, these require no additional follow-up. This recommendation follows the consensus statement: Guidelines for Management of Small Pulmonary Nodules Detected on CT Images: From the Fleischner Society 2017; Radiology ?2017; 469:629-528. ?2. Sequela of prior granulomatous disease. ?3. Aortic  Atherosclerosis (ICD10-I70.0) and Emphysema (ICD10-J43.9). ? ?TTE 09/05/19: ?IMPRESSIONS  ? 1. Left ventricular ejection fraction, by estimation, is 50 to 55%. The left ventricle has low normal function. The left ventricle has

## 2021-09-13 ENCOUNTER — Other Ambulatory Visit: Payer: Self-pay

## 2021-09-13 ENCOUNTER — Encounter: Payer: Self-pay | Admitting: Cardiology

## 2021-09-13 ENCOUNTER — Ambulatory Visit: Payer: BC Managed Care – PPO | Admitting: Cardiology

## 2021-09-13 VITALS — BP 142/84 | HR 95 | Ht 73.0 in | Wt 274.8 lb

## 2021-09-13 DIAGNOSIS — I10 Essential (primary) hypertension: Secondary | ICD-10-CM

## 2021-09-13 DIAGNOSIS — E785 Hyperlipidemia, unspecified: Secondary | ICD-10-CM

## 2021-09-13 DIAGNOSIS — I2510 Atherosclerotic heart disease of native coronary artery without angina pectoris: Secondary | ICD-10-CM

## 2021-09-13 DIAGNOSIS — Z9889 Other specified postprocedural states: Secondary | ICD-10-CM | POA: Diagnosis not present

## 2021-09-13 DIAGNOSIS — R0683 Snoring: Secondary | ICD-10-CM

## 2021-09-13 DIAGNOSIS — Z01812 Encounter for preprocedural laboratory examination: Secondary | ICD-10-CM | POA: Diagnosis not present

## 2021-09-13 DIAGNOSIS — R911 Solitary pulmonary nodule: Secondary | ICD-10-CM

## 2021-09-13 DIAGNOSIS — I480 Paroxysmal atrial fibrillation: Secondary | ICD-10-CM

## 2021-09-13 DIAGNOSIS — E669 Obesity, unspecified: Secondary | ICD-10-CM

## 2021-09-13 DIAGNOSIS — Z9289 Personal history of other medical treatment: Secondary | ICD-10-CM

## 2021-09-13 NOTE — Patient Instructions (Signed)
Medication Instructions:  ? ?Your physician recommends that you continue on your current medications as directed. Please refer to the Current Medication list given to you today. ? ?*If you need a refill on your cardiac medications before your next appointment, please call your pharmacy* ? ? ?Lab Work: ? ?ON Monday 09/25/21 HERE IN THE OFFICE--PRE-PROCEDURE LABS--BMET AND CBC W DIFF ? ?If you have labs (blood work) drawn today and your tests are completely normal, you will receive your results only by: ?MyChart Message (if you have MyChart) OR ?A paper copy in the mail ?If you have any lab test that is abnormal or we need to change your treatment, we will call you to review the results. ? ? ?You have been referred to OUR ELECTROPHYSIOLOGIST DR. Quentin Ore OR DR. Curt Bears FOR CONSIDERATION OF AFIB ABLATION ? ? ?Testing/Procedures: ? ?Your physician has recommended that you have a SPLIT NIGHT sleep study. This test records several body functions during sleep, including: brain activity, eye movement, oxygen and carbon dioxide blood levels, heart rate and rhythm, breathing rate and rhythm, the flow of air through your mouth and nose, snoring, body muscle movements, and chest and belly movement. YOU WILL GET A CALL BACK FROM OUR SLEEP STUDY COORDINATOR TO SCHEDULE YOUR SLEEP STUDY--HER NAME IS NINA JONES ? ? ? ?CARDIOVERSION: ? ?You are scheduled for a  Cardioversion on Tuesday 09/26/21 with Dr. Johnsie Cancel.  Please arrive at the [facility] (Main Entrance A) at [facility]: [address] [place],  [address] at 8 AM AM. (1 hour prior to procedure unless lab work is needed; if lab work is needed arrive 1.5 hours ahead) ? ?DIET: Nothing to eat or drink after midnight except a sip of water with medications (see medication instructions below) ? ?FYI: For your safety, and to allow [place] to monitor your vital signs accurately during the surgery/procedure we request that   ?if you have artificial nails, gel coating, SNS etc.  Please have those removed prior to your surgery/procedure. Not having the nail coverings /polish removed may result in cancellation or delay of your surgery/procedure. ? ? ?Medication Instructions: ? ?Continue your anticoagulant: ELIQUIS You will need to continue your anticoagulant after your procedure until you are told by your Provider that it is safe to stop ? ? ?Labs:  ? ?Come to: Monday 09/25/21 HERE AT THE OFFICE ?(Lab option #1) Come to the lab at [place] between the hours of 8:00 am and 4:30 pm. You do not have to be fasting. ? ? ?You must have a responsible person to drive you home and stay in the waiting area during your procedure. Failure to do so could result in cancellation. ? ?Interior and spatial designer cards. ? ?*Special Note: Every effort is made to have your procedure done on time. Occasionally there are emergencies that occur at the hospital that may cause delays. Please be patient if a delay does occur.  ? ? ? ?Follow-Up: ? ?4 MONTHS IN THE OFFICE WITH AN EXTENDER ? ? ? ?

## 2021-09-19 ENCOUNTER — Encounter (HOSPITAL_COMMUNITY): Payer: Self-pay | Admitting: Cardiovascular Disease

## 2021-09-19 NOTE — Progress Notes (Signed)
Attempted to obtain medical history via telephone, unable to reach at this time. Unable to leave a voicemail to return pre surgical testing department's phone call.  ?

## 2021-09-25 ENCOUNTER — Other Ambulatory Visit: Payer: BC Managed Care – PPO | Admitting: *Deleted

## 2021-09-25 DIAGNOSIS — Z01812 Encounter for preprocedural laboratory examination: Secondary | ICD-10-CM

## 2021-09-25 DIAGNOSIS — I480 Paroxysmal atrial fibrillation: Secondary | ICD-10-CM

## 2021-09-25 DIAGNOSIS — R0683 Snoring: Secondary | ICD-10-CM

## 2021-09-25 LAB — BASIC METABOLIC PANEL
BUN/Creatinine Ratio: 12 (ref 10–24)
BUN: 13 mg/dL (ref 8–27)
CO2: 27 mmol/L (ref 20–29)
Calcium: 10 mg/dL (ref 8.6–10.2)
Chloride: 103 mmol/L (ref 96–106)
Creatinine, Ser: 1.08 mg/dL (ref 0.76–1.27)
Glucose: 96 mg/dL (ref 70–99)
Potassium: 3.6 mmol/L (ref 3.5–5.2)
Sodium: 139 mmol/L (ref 134–144)
eGFR: 78 mL/min/{1.73_m2} (ref >59)

## 2021-09-25 LAB — CBC WITH DIFFERENTIAL/PLATELET
Basophils Absolute: 0.1 10*3/uL (ref 0.0–0.2)
Basos: 1 %
EOS (ABSOLUTE): 0.1 10*3/uL (ref 0.0–0.4)
Eos: 1 %
Hematocrit: 45.7 % (ref 37.5–51.0)
Hemoglobin: 15.7 g/dL (ref 13.0–17.7)
Lymphocytes Absolute: 2.3 10*3/uL (ref 0.7–3.1)
Lymphs: 22 %
MCH: 29.2 pg (ref 26.6–33.0)
MCHC: 34.4 g/dL (ref 31.5–35.7)
MCV: 85 fL (ref 79–97)
Monocytes Absolute: 0.8 10*3/uL (ref 0.1–0.9)
Monocytes: 7 %
Neutrophils Absolute: 7.7 10*3/uL — ABNORMAL HIGH (ref 1.4–7.0)
Neutrophils: 69 %
Platelets: 239 10*3/uL (ref 150–450)
RBC: 5.37 x10E6/uL (ref 4.14–5.80)
RDW: 14.6 % (ref 11.6–15.4)
WBC: 10.9 10*3/uL — ABNORMAL HIGH (ref 3.4–10.8)

## 2021-09-25 NOTE — Anesthesia Preprocedure Evaluation (Addendum)
Anesthesia Evaluation  ?Patient identified by MRN, date of birth, ID band ?Patient awake ? ? ? ?Reviewed: ?Allergy & Precautions, NPO status , Patient's Chart, lab work & pertinent test results ? ?Airway ?Mallampati: II ? ?TM Distance: >3 FB ? ? ? ? Dental ?  ?Pulmonary ?COPD, Current Smoker and Patient abstained from smoking.,  ?  ?breath sounds clear to auscultation ? ? ? ? ? ? Cardiovascular ?hypertension, + CAD  ? ?Rhythm:Irregular Rate:Normal ? ? ?  ?Neuro/Psych ?PSYCHIATRIC DISORDERS   ? GI/Hepatic ?negative GI ROS, Neg liver ROS,   ?Endo/Other  ? ? Renal/GU ?negative Renal ROS  ? ?  ?Musculoskeletal ? ? Abdominal ?  ?Peds ? Hematology ?  ?Anesthesia Other Findings ? ? Reproductive/Obstetrics ? ?  ? ? ? ? ? ? ? ? ? ? ? ? ? ?  ?  ? ? ? ? ? ? ? ?Anesthesia Physical ?Anesthesia Plan ? ?ASA: 3 ? ?Anesthesia Plan: MAC and General  ? ?Post-op Pain Management:   ? ?Induction:  ? ?PONV Risk Score and Plan: Treatment may vary due to age or medical condition ? ?Airway Management Planned: Nasal Cannula, Simple Face Mask and Mask ? ?Additional Equipment:  ? ?Intra-op Plan:  ? ?Post-operative Plan:  ? ?Informed Consent: I have reviewed the patients History and Physical, chart, labs and discussed the procedure including the risks, benefits and alternatives for the proposed anesthesia with the patient or authorized representative who has indicated his/her understanding and acceptance.  ? ? ? ?Dental advisory given ? ?Plan Discussed with: Anesthesiologist and CRNA ? ?Anesthesia Plan Comments:   ? ? ? ? ? ?Anesthesia Quick Evaluation ? ?

## 2021-09-26 ENCOUNTER — Ambulatory Visit (HOSPITAL_COMMUNITY): Payer: BC Managed Care – PPO | Admitting: Anesthesiology

## 2021-09-26 ENCOUNTER — Encounter (HOSPITAL_COMMUNITY)
Admission: RE | Disposition: A | Discharge status: Home / Self Care | Payer: Self-pay | Source: Home / Self Care | Attending: Cardiovascular Disease

## 2021-09-26 ENCOUNTER — Ambulatory Visit (HOSPITAL_COMMUNITY)
Admission: RE | Admit: 2021-09-26 | Discharge: 2021-09-26 | Disposition: A | Discharge status: Home / Self Care | Payer: BC Managed Care – PPO | Attending: Cardiovascular Disease | Admitting: Cardiovascular Disease

## 2021-09-26 ENCOUNTER — Encounter (HOSPITAL_COMMUNITY): Payer: Self-pay | Admitting: Cardiovascular Disease

## 2021-09-26 ENCOUNTER — Other Ambulatory Visit: Payer: Self-pay

## 2021-09-26 DIAGNOSIS — I4891 Unspecified atrial fibrillation: Secondary | ICD-10-CM

## 2021-09-26 DIAGNOSIS — J439 Emphysema, unspecified: Secondary | ICD-10-CM | POA: Diagnosis not present

## 2021-09-26 DIAGNOSIS — I491 Atrial premature depolarization: Secondary | ICD-10-CM | POA: Diagnosis not present

## 2021-09-26 DIAGNOSIS — Z6836 Body mass index (BMI) 36.0-36.9, adult: Secondary | ICD-10-CM | POA: Insufficient documentation

## 2021-09-26 DIAGNOSIS — I480 Paroxysmal atrial fibrillation: Secondary | ICD-10-CM | POA: Insufficient documentation

## 2021-09-26 DIAGNOSIS — F419 Anxiety disorder, unspecified: Secondary | ICD-10-CM | POA: Insufficient documentation

## 2021-09-26 DIAGNOSIS — I700 Atherosclerosis of aorta: Secondary | ICD-10-CM | POA: Diagnosis not present

## 2021-09-26 DIAGNOSIS — E669 Obesity, unspecified: Secondary | ICD-10-CM | POA: Diagnosis not present

## 2021-09-26 DIAGNOSIS — R0789 Other chest pain: Secondary | ICD-10-CM | POA: Insufficient documentation

## 2021-09-26 DIAGNOSIS — R5383 Other fatigue: Secondary | ICD-10-CM | POA: Diagnosis not present

## 2021-09-26 DIAGNOSIS — I2510 Atherosclerotic heart disease of native coronary artery without angina pectoris: Secondary | ICD-10-CM | POA: Insufficient documentation

## 2021-09-26 DIAGNOSIS — R002 Palpitations: Secondary | ICD-10-CM | POA: Insufficient documentation

## 2021-09-26 DIAGNOSIS — F17290 Nicotine dependence, other tobacco product, uncomplicated: Secondary | ICD-10-CM | POA: Diagnosis not present

## 2021-09-26 DIAGNOSIS — I10 Essential (primary) hypertension: Secondary | ICD-10-CM | POA: Diagnosis not present

## 2021-09-26 DIAGNOSIS — I4819 Other persistent atrial fibrillation: Secondary | ICD-10-CM

## 2021-09-26 DIAGNOSIS — J449 Chronic obstructive pulmonary disease, unspecified: Secondary | ICD-10-CM | POA: Diagnosis not present

## 2021-09-26 HISTORY — PX: CARDIOVERSION: SHX1299

## 2021-09-26 SURGERY — CARDIOVERSION
Anesthesia: Monitor Anesthesia Care

## 2021-09-26 MED ORDER — LIDOCAINE 2% (20 MG/ML) 5 ML SYRINGE
INTRAMUSCULAR | Status: DC | PRN
Start: 2021-09-26 — End: 2021-09-26
  Administered 2021-09-26: 40 mg via INTRAVENOUS

## 2021-09-26 MED ORDER — PROPOFOL 10 MG/ML IV BOLUS
INTRAVENOUS | Status: DC | PRN
  Administered 2021-09-26: 110 mg via INTRAVENOUS

## 2021-09-26 MED ORDER — SODIUM CHLORIDE 0.9 % IV SOLN: INTRAVENOUS | Status: DC | PRN

## 2021-09-26 NOTE — Anesthesia Postprocedure Evaluation (Signed)
Anesthesia Post Note ? ?Patient: James Weeks ? ?Procedure(s) Performed: CARDIOVERSION ? ?  ? ?Patient location during evaluation: PACU ?Anesthesia Type: MAC ?Level of consciousness: awake ?Pain management: pain level controlled ?Vital Signs Assessment: post-procedure vital signs reviewed and stable ?Respiratory status: spontaneous breathing ?Cardiovascular status: stable ?Postop Assessment: no apparent nausea or vomiting ?Anesthetic complications: no ? ? ?No notable events documented. ? ?Last Vitals:  ?Vitals:  ? 09/26/21 0930 09/26/21 0942  ?BP: 125/81 118/83  ?Pulse: 73 74  ?Resp: (!) 21 17  ?Temp:    ?SpO2: 97% 96%  ?  ?Last Pain:  ?Vitals:  ? 09/26/21 0942  ?TempSrc:   ?PainSc: 0-No pain  ? ? ?  ?  ?  ?  ?  ?  ? ?[name] ? ? ? ? ?

## 2021-09-26 NOTE — Anesthesia Procedure Notes (Addendum)
Procedure Name: [place] ?Date/Time: 09/26/2021 9:10 AM ?Performed by: Dorann Lodge, CRNA ?Pre-anesthesia Checklist: Patient identified, Emergency Drugs available, Suction available and Patient being monitored ?Patient Re-evaluated:Patient Re-evaluated prior to induction ?Oxygen Delivery Method: Nasal cannula ?Induction Type: IV induction ? ? ? ? ?

## 2021-09-26 NOTE — Interval H&P Note (Signed)
History and Physical Interval Note: ? ?09/26/2021 ?8:11 AM ? ?James Weeks  has presented today for surgery, with the diagnosis of AFIB.  The various methods of treatment have been discussed with the patient and family. After consideration of risks, benefits and other options for treatment, the patient has consented to  Procedure(s): ?CARDIOVERSION (N/A) as a surgical intervention.  The patient's history has been reviewed, patient examined, no change in status, stable for surgery.  I have reviewed the patient's chart and labs.  Questions were answered to the patient's satisfaction.   ? ? ?[name] ? ? ?

## 2021-09-26 NOTE — Transfer of Care (Signed)
Immediate Anesthesia Transfer of Care Note ? ?Patient: James Weeks ? ?Procedure(s) Performed: CARDIOVERSION ? ?Patient Location: Endoscopy Unit ? ?Anesthesia Type:MAC ? ?Level of Consciousness: awake and drowsy ? ?Airway & Oxygen Therapy: Patient Spontanous Breathing and Patient connected to nasal cannula oxygen ? ?Post-op Assessment: Report given to RN and Post -op Vital signs reviewed and stable ? ?Post vital signs: Reviewed and stable ? ?Last Vitals:  ?Vitals Value Taken Time  ?BP 131/90   ?Temp    ?Pulse 74   ?Resp 18   ?SpO2 98   ? ? ?Last Pain:  ?Vitals:  ? 09/26/21 0817  ?TempSrc: Temporal  ?PainSc: 0-No pain  ?   ? ?  ? ?Complications: No notable events documented. ?

## 2021-09-26 NOTE — CV Procedure (Signed)
DCC: ?Anesthesia:  Dr Nyoka Cowden 110 mg propofol 40 mg Lidocaine ?On Rx eliquis no missed doses ? ?Allen x 5 all 200 J ?First 3 AP position ?Last 2 Anterior pads  ?Compression manual also used ? ?Failed to convert to NSR ?Should have outpatient f/u arranged for EP evaluation ? Ablation  ? ?[name] MD [facility] ?

## 2021-09-26 NOTE — Discharge Instructions (Signed)

## 2021-09-26 NOTE — Anesthesia Postprocedure Evaluation (Signed)
Anesthesia Post Note ? ?Patient: James Weeks ? ?Procedure(s) Performed: CARDIOVERSION ? ?  ? ?Patient location during evaluation: Endoscopy ?Anesthesia Type: MAC ?Level of consciousness: awake ?Pain management: pain level controlled ?Vital Signs Assessment: post-procedure vital signs reviewed and stable ?Respiratory status: spontaneous breathing ?Cardiovascular status: stable ?Postop Assessment: no apparent nausea or vomiting ?Anesthetic complications: no ? ? ?No notable events documented. ? ?Last Vitals:  ?Vitals:  ? 09/26/21 0930 09/26/21 0942  ?BP: 125/81 118/83  ?Pulse: 73 74  ?Resp: (!) 21 17  ?Temp:    ?SpO2: 97% 96%  ?  ?Last Pain:  ?Vitals:  ? 09/26/21 0942  ?TempSrc:   ?PainSc: 0-No pain  ? ? ?  ?  ?  ?  ?  ?  ? ?Yaris Ferrell ? ? ? ? ?

## 2021-09-28 ENCOUNTER — Telehealth: Payer: Self-pay | Admitting: *Deleted

## 2021-09-28 NOTE — Telephone Encounter (Signed)
Prior Authorization for SPLIT sent to [facility] via Phone.    ?FAITH B. NO PA REQUIRED-REF# [id number].Marland Kitchen  ?

## 2021-10-02 ENCOUNTER — Encounter: Payer: Self-pay | Admitting: Cardiology

## 2021-10-09 ENCOUNTER — Encounter: Payer: Self-pay | Admitting: *Deleted

## 2021-10-09 ENCOUNTER — Telehealth: Payer: Self-pay | Admitting: *Deleted

## 2021-10-09 ENCOUNTER — Encounter: Payer: Self-pay | Admitting: Cardiology

## 2021-10-09 ENCOUNTER — Ambulatory Visit: Payer: BC Managed Care – PPO | Admitting: Cardiology

## 2021-10-09 DIAGNOSIS — I4819 Other persistent atrial fibrillation: Secondary | ICD-10-CM | POA: Diagnosis not present

## 2021-10-09 DIAGNOSIS — Z01812 Encounter for preprocedural laboratory examination: Secondary | ICD-10-CM | POA: Diagnosis not present

## 2021-10-09 DIAGNOSIS — D6869 Other thrombophilia: Secondary | ICD-10-CM | POA: Diagnosis not present

## 2021-10-09 MED ORDER — METOPROLOL TARTRATE 50 MG PO TABS: 50.0000 mg | ORAL_TABLET | Freq: Once | ORAL | 0 refills | Status: DC

## 2021-10-09 NOTE — Progress Notes (Signed)
? ?Electrophysiology Office Note ? ? ?Date:  10/09/2021  ? ?ID:  James Weeks, DOB 20-Oct-1960, MRN [id number] ? ?PCP:  Earlyne Iba, NP  ?Cardiologist:  Johney Frame ?Primary Electrophysiologist:  Latravion Graves Meredith Leeds, MD   ? ?Chief Complaint: AF ?  ?History of Present Illness: ?James Weeks is a 61 y.o. male who is being seen today for the evaluation of AF at the request of [name], MD. Presenting today for electrophysiology evaluation. ? ?He has a history significant hypertension, hyperlipidemia, elevated PSA, atrial fibrillation.  He was seen March 2021 and was found to be in new onset atrial fibrillation.  He was asymptomatic and was started on Eliquis.  He had a cardioversion May 2021 with return to sinus rhythm.  Unfortunately has developed recurrent atrial fibrillation.  He is fatigued with quite a bit less energy.  He has chest tightness and palpitations. ? ?Today, he denies symptoms of palpitations, chest pain, orthopnea, PND, lower extremity edema, claudication, dizziness, presyncope, syncope, bleeding, or neurologic sequela. The patient is tolerating medications without difficulties.  He did have a recent cardioversion.  Unfortunately he went back into atrial fibrillation after 3 days.  His symptoms of fatigue, shortness of breath, right-sided chest tightness returned.  He would like to avoid further medications for his atrial fibrillation. ? ? ?Past Medical History:  ?Diagnosis Date  ? Allergic rhinitis due to pollen   ? Anticoagulant long-term use   ? eliquis-- managed by cardiology  ? Anxiety   ? Emphysema lung ([place])   ? per chest CT in epic 09-29-2020  ? Hyperlipidemia   ? Hypertension   ? Mild CAD   ? cardiologist--- [name]---  per coronary CT 09-05-2018 mild cad involving midLAD, moderate torturous coronary arteries, calcium score =1  ? PAF (paroxysmal atrial fibrillation) ([place])   ? cardiologist-- [name]-- dx 03/ 2021 asymptomatic  ? Prostate cancer ([place])   ?  urologist--- dr gay--- dx 04/ 2020 Gleason 3+3 active survillence;  bx 12/ 2020 ,Gleason3+4 survillence;  MRI fusion bx 04/ 2022 Gleason 3+4  ? Pulmonary nodules   ? last chest CT in epic 09-29-2020 bilateral upper lobe nodules stable, benign  ? ?Past Surgical History:  ?Procedure Laterality Date  ? CARDIOVERSION N/A 10/30/2019  ? Procedure: CARDIOVERSION;  Surgeon: [name], MD;  Location: [place];  Service: Cardiovascular;  Laterality: N/A;  ? CARDIOVERSION N/A 09/26/2021  ? Procedure: CARDIOVERSION;  Surgeon: Josue Hector, MD;  Location: [facility] ENDOSCOPY;  Service: Cardiovascular;  Laterality: N/A;  ? COLONOSCOPY WITH PROPOFOL  2012  ? PROSTATE BIOPSY    ? RADIOACTIVE SEED IMPLANT N/A 02/06/2021  ? Procedure: RADIOACTIVE SEED IMPLANT/BRACHYTHERAPY IMPLANT WITH CYSTOSCOPY;  Surgeon: [name], MD;  Location: [facility];  Service: Urology;  Laterality: N/A;  74 seeds; No seeds detected in bladder per Dr. Abner Greenspan  ? ? ? ?Current Outpatient Medications  ?Medication Sig Dispense Refill  ? amLODipine (NORVASC) 5 MG tablet TAKE 1 TABLET BY MOUTH EVERYDAY AT BEDTIME 90 tablet 1  ? apixaban (ELIQUIS) 5 MG TABS tablet Take 1 tablet (5 mg total) by mouth 2 (two) times daily. 180 tablet 1  ? diazepam (VALIUM) 10 MG tablet Take 5 mg by mouth daily as needed for anxiety.     ? fluticasone (FLONASE) 50 MCG/ACT nasal spray Place 50 mcg into the nose daily as needed for allergies or rhinitis.    ? loratadine (CLARITIN) 10 MG tablet Take 10 mg by mouth daily.    ?  metoprolol succinate (TOPROL-XL) 100 MG 24 hr tablet Take 1 tablet (100 mg total) by mouth at bedtime. 90 tablet 0  ? [START ON 11/23/2021] metoprolol tartrate (LOPRESSOR) 50 MG tablet Take 1 tablet (50 mg total) by mouth once for 1 dose. Take 2 hours prior to CT testing if heart rate greater than 70 1 tablet 0  ? rosuvastatin (CRESTOR) 10 MG tablet Take 1 tablet (10 mg total) by mouth daily. 90 tablet 1  ? tamsulosin (FLOMAX) 0.4 MG CAPS capsule  Take 0.4 mg by mouth at bedtime.    ? ?No current facility-administered medications for this visit.  ? ? ?Allergies:   Patient has no known allergies.  ? ?Social History:  The patient  reports that he has been smoking cigars and cigarettes. He quit smokeless tobacco use about 38 years ago.  His smokeless tobacco use included chew. He reports current alcohol use. He reports that he does not use drugs.  ? ?Family History:  The patient's family history includes Asthma in his brother and father; Breast cancer in his maternal aunt and mother; CVA in an other family member; Cancer in his maternal grandmother and paternal grandmother; Diabetes in his mother; Heart attack in an other family member; Heart disease in his father and another family member; Hypertension in his mother and another family member; Lung cancer in his maternal grandfather; Other (age of onset: 55) in his father; Prostate cancer in his paternal grandfather and paternal uncle.  ? ? ?ROS:  Please see the history of present illness.   Otherwise, review of systems is positive for none.   All other systems are reviewed and negative.  ? ? ?PHYSICAL EXAM: ?VS:  BP 126/86   Pulse 75   Ht '6\' 1"'$  (1.854 m)   Wt 270 lb (122.5 kg)   SpO2 97%   BMI 35.62 kg/m?  , BMI Body mass index is 35.62 kg/m?. ?GEN: Well nourished, well developed, in no acute distress  ?HEENT: normal  ?Neck: no JVD, carotid bruits, or masses ?Cardiac: Irregular; no murmurs, rubs, or gallops,no edema  ?Respiratory:  clear to auscultation bilaterally, normal work of breathing ?GI: soft, nontender, nondistended, + BS ?MS: no deformity or atrophy  ?Skin: warm and dry ?Neuro:  Strength and sensation are intact ?Psych: euthymic mood, full affect ? ?EKG:  EKG is ordered today. ?Personal review of the ekg ordered shows atrial fibrillation, rate 75 ? ?Recent Labs: ?02/02/2021: ALT 21 ?09/25/2021: BUN 13; Creatinine, Ser 1.08; Hemoglobin 15.7; Platelets 239; Potassium 3.6; Sodium 139  ? ? ?Lipid Panel   ?   ?Component Value Date/Time  ? CHOL 198 01/05/2021 1030  ? TRIG 191 (H) 01/05/2021 1030  ? HDL 33 (L) 01/05/2021 1030  ? CHOLHDL 6.0 (H) 01/05/2021 1030  ? LDLCALC 131 (H) 01/05/2021 1030  ? ? ? ?Wt Readings from Last 3 Encounters:  ?10/09/21 270 lb (122.5 kg)  ?09/13/21 274 lb 12.8 oz (124.6 kg)  ?03/03/21 260 lb 2 oz (118 kg)  ?  ? ? ?Other studies Reviewed: ?Additional studies/ records that were reviewed today include: TTE 09/23/19  ?Review of the above records today demonstrates:  ? 1. Left ventricular ejection fraction, by estimation, is 50 to 55%. The  ?left ventricle has low normal function. The left ventricle has no regional  ?wall motion abnormalities. Left ventricular diastolic function could not  ?be evaluated.  ? 2. Right ventricular systolic function is normal. The right ventricular  ?size is normal. The estimated right ventricular systolic pressure  is 26.4  ?mmHg.  ? 3. Left atrial size was mildly dilated.  ? 4. The mitral valve is normal in structure. Trivial mitral valve  ?regurgitation. No evidence of mitral stenosis.  ? 5. The aortic valve is normal in structure. Aortic valve regurgitation is  ?not visualized. Mild aortic valve sclerosis is present, with no evidence  ?of aortic valve stenosis.  ? 6. The inferior vena cava is normal in size with greater than 50%  ?respiratory variability, suggesting right atrial pressure of 3 mmHg.  ? ? ?ASSESSMENT AND PLAN: ? ?1.  1.  Persistent atrial fibrillation: Currently on Toprol-XL 100 mg daily, Eliquis 5 mg twice daily.  CHA2DS2-VASc of 2.  Unfortunately he is back in atrial fibrillation and feeling poorly.  He would prefer to avoid medication management for his atrial fibrillation.  Due to that, we Ardenia Stiner plan for ablation.  Risk and benefits have been discussed which he understands and is agreed to the procedure. ? ?2.  Hypertension: Currently well controlled ? ?3.  Mild coronary artery disease: Calcium score of 1.  Continue Crestor 10 mg daily per  primary cardiology. ? ?4.  Secondary hypercoagulable state: Currently on Eliquis for atrial fibrillation as above ? ?5.  Morbid obesity: Body mass index is 35.62 kg/m?. ?Diet and exercise encouraged ? ?6.  Snoring: Has

## 2021-10-09 NOTE — Addendum Note (Signed)
Addended by: Jordan Likes on: 10/09/2021 12:05 PM ? ? Modules accepted: Orders ? ?

## 2021-10-09 NOTE — Telephone Encounter (Signed)
Pts split night sleep study is scheduled for 11/05/21.  Pt made aware of appt date and time by Sleep Study Coordinator. ?

## 2021-10-09 NOTE — Patient Instructions (Signed)
Medication Instructions:  ?Your physician recommends that you continue on your current medications as directed. Please refer to the Current Medication list given to you today. ? ?*If you need a refill on your cardiac medications before your next appointment, please call your pharmacy* ? ? ?Lab Work: ?Pre procedure labs 11/08/2021:  BMP & CBC ? ?If you have labs (blood work) drawn today and your tests are completely normal, you will receive your results only by: ?MyChart Message (if you have MyChart) OR ?A paper copy in the mail ?If you have any lab test that is abnormal or we need to change your treatment, we will call you to review the results. ? ? ?Testing/Procedures: ?Your physician has requested that you have cardiac CT within 7 days PRIOR to your ablation. Cardiac computed tomography (CT) is a painless test that uses an x-ray machine to take clear, detailed pictures of your heart.  Please follow instruction below located under "other instructions". ?You will get a call from our office to schedule the date for this test. ? ?Your physician has recommended that you have an ablation. Catheter ablation is a medical procedure used to treat some cardiac arrhythmias (irregular heartbeats). During catheter ablation, a long, thin, flexible tube is put into a blood vessel in your groin (upper thigh), or neck. This tube is called an ablation catheter. It is then guided to your heart through the blood vessel. Radio frequency waves destroy small areas of heart tissue where abnormal heartbeats may cause an arrhythmia to start. Please follow instruction letter given to you today. ? ? ?Follow-Up: ?At [facility], you and your health needs are our priority.  As part of our continuing mission to provide you with exceptional heart care, we have created designated Provider Care Teams.  These Care Teams include your primary Cardiologist (physician) and Advanced Practice Providers (APPs -  Physician Assistants and Nurse  Practitioners) who all work together to provide you with the care you need, when you need it. ? ?Your next appointment:   ?1 month(s) after your ablation ? ?The format for your next appointment:   ?In Person ? ?Provider:   ?AFib clinic ? ? ?Thank you for choosing [facility]!! ? ? ?Trinidad Curet, RN ?[phone] ? ? ? ?Other Instructions ? ?Cardiac Ablation ?Cardiac ablation is a procedure to destroy (ablate) some heart tissue that is sending bad signals. These bad signals cause problems in heart rhythm. ?The heart has many areas that make these signals. If there are problems in these areas, they can make the heart beat in a way that is not normal. Destroying some tissues can help make the heart rhythm normal. ?Tell your doctor about: ?Any allergies you have. ?All medicines you are taking. These include vitamins, herbs, eye drops, creams, and over-the-counter medicines. ?Any problems you or family members have had with medicines that make you fall asleep (anesthetics). ?Any blood disorders you have. ?Any surgeries you have had. ?Any medical conditions you have, such as kidney failure. ?Whether you are pregnant or may be pregnant. ?What are the risks? ?This is a safe procedure. But problems may occur, including: ?Infection. ?Bruising and bleeding. ?Bleeding into the chest. ?Stroke or blood clots. ?Damage to nearby areas of your body. ?Allergies to medicines or dyes. ?The need for a pacemaker if the normal system is damaged. ?Failure of the procedure to treat the problem. ?What happens before the procedure? ?Medicines ?Ask your doctor about: ?Changing or stopping your normal medicines. This is important. ?Taking aspirin and  ibuprofen. Do not take these medicines unless your doctor tells you to take them. ?Taking other medicines, vitamins, herbs, and supplements. ?General instructions ?Follow instructions from your doctor about what you cannot eat or drink. ?Plan to have someone take you home from the hospital or  clinic. ?If you will be going home right after the procedure, plan to have someone with you for 24 hours. ?Ask your doctor what steps will be taken to prevent infection. ?What happens during the procedure? ? ?An IV tube will be put into one of your veins. ?You will be given a medicine to help you relax. ?The skin on your neck or groin will be numbed. ?A cut (incision) will be made in your neck or groin. A needle will be put through your cut and into a large vein. ?A tube (catheter) will be put into the needle. The tube will be moved to your heart. ?Dye may be put through the tube. This helps your doctor see your heart. ?Small devices (electrodes) on the tube will send out signals. ?A type of energy will be used to destroy some heart tissue. ?The tube will be taken out. ?Pressure will be held on your cut. This helps stop bleeding. ?A bandage will be put over your cut. ?The exact procedure may vary among doctors and hospitals. ?What happens after the procedure? ?You will be watched until you leave the hospital or clinic. This includes checking your heart rate, breathing rate, oxygen, and blood pressure. ?Your cut will be watched for bleeding. You will need to lie still for a few hours. ?Do not drive for 24 hours or as long as your doctor tells you. ?Summary ?Cardiac ablation is a procedure to destroy some heart tissue. This is done to treat heart rhythm problems. ?Tell your doctor about any medical conditions you may have. Tell him or her about all medicines you are taking to treat them. ?This is a safe procedure. But problems may occur. These include infection, bruising, bleeding, and damage to nearby areas of your body. ?Follow what your doctor tells you about food and drink. You may also be told to change or stop some of your medicines. ?After the procedure, do not drive for 24 hours or as long as your doctor tells you. ?This information is not intended to replace advice given to you by your health care provider.  Make sure you discuss any questions you have with your health care provider. ?Document Revised: 05/14/2019 Document Reviewed: 05/14/2019 ?Elsevier Patient Education ? [place]. ? ?

## 2021-10-09 NOTE — Telephone Encounter (Signed)
-----   Message from Nuala Alpha, LPN sent at 1/61/0960  3:39 PM EDT ----- ?Regarding: split night sleep study ?Dr. Johney Frame ordered a split night sleep study on this pt for afib and snoring.  Split night sleep study only.   ? ?Please pre-cert and schedule then notify me of the date.  Order is in and pt aware you will coordinate to schedule this with him ? ?Thanks, ?Marigrace Mccole  ? ? ? ?

## 2021-11-03 ENCOUNTER — Ambulatory Visit (HOSPITAL_BASED_OUTPATIENT_CLINIC_OR_DEPARTMENT_OTHER): Payer: BC Managed Care – PPO | Attending: Cardiology | Admitting: Cardiology

## 2021-11-03 ENCOUNTER — Other Ambulatory Visit: Payer: Self-pay

## 2021-11-03 DIAGNOSIS — R0683 Snoring: Secondary | ICD-10-CM

## 2021-11-03 DIAGNOSIS — I480 Paroxysmal atrial fibrillation: Secondary | ICD-10-CM | POA: Insufficient documentation

## 2021-11-03 DIAGNOSIS — G4733 Obstructive sleep apnea (adult) (pediatric): Secondary | ICD-10-CM | POA: Diagnosis not present

## 2021-11-05 ENCOUNTER — Encounter (HOSPITAL_BASED_OUTPATIENT_CLINIC_OR_DEPARTMENT_OTHER): Payer: BC Managed Care – PPO | Admitting: Cardiology

## 2021-11-07 NOTE — Procedures (Signed)
? ?  Patient Name: James Weeks, James Weeks ?Study Date:11/03/2021 ?Gender: Male ?D.O.B: [birth year] ?Age (years): 52 ?Referring Provider: Gwyndolyn Kaufman MD ?Height (inches): 71 ?Interpreting Physician: Fransico Him MD, ABSM ?Weight (lbs): 270 ?RPSGT: Baxter Flattery ?BMI: 38 ?MRN: [id number] ?Neck Size: 18.50 ? ?CLINICAL INFORMATION ?Sleep Study Type: NPSG ? ?Indication for sleep study: Obesity, Snoring, Witnesses Apnea / Gasping During Sleep ? ?Epworth Sleepiness Score: 6 ? ?SLEEP STUDY TECHNIQUE ?As per the AASM Manual for the Scoring of Sleep and Associated Events v2.3 (April 2016) with a hypopnea requiring 4% desaturations. ? ?The channels recorded and monitored were frontal, central and occipital EEG, electrooculogram (EOG), submentalis EMG (chin), nasal and oral airflow, thoracic and abdominal wall motion, anterior tibialis EMG, snore microphone, electrocardiogram, and pulse oximetry. ? ?MEDICATIONS ?Medications self-administered by patient taken the night of the study : N/A ? ?SLEEP ARCHITECTURE ?The study was initiated at 10:18:35 PM and ended at 4:33:54 AM. ? ?Sleep onset time was 31.2 minutes and the sleep efficiency was 67.5%. The total sleep time was 253.2 minutes. ? ?Stage REM latency was 268.0 minutes. ? ?The patient spent 5.3% of the night in stage N1 sleep, 82.6% in stage N2 sleep, 0.0% in stage N3 and 12.1% in REM. ? ?Alpha intrusion was absent. ? ?Supine sleep was 25.28%. ? ?RESPIRATORY PARAMETERS ?The overall apnea/hypopnea index (AHI) was 10.7 per hour. There were 12 total apneas, including 12 obstructive, 0 central and 0 mixed apneas. There were 33 hypopneas and 1 RERAs. ? ?The AHI during Stage REM sleep was 31.5 per hour. ? ?AHI while supine was 20.6 per hour. ? ?The mean oxygen saturation was 92.4%. The minimum SpO2 during sleep was 76.0%. ? ?moderate snoring was noted during this study. ? ?CARDIAC DATA ?The 2 lead EKG demonstrated atrial fibrillation. The mean heart rate was 75.9 beats per minute.   ? ?LEG MOVEMENT DATA ?The total PLMS were 0 with a resulting PLMS index of 0.0. Associated arousal with leg movement index was 0.0 . ? ?IMPRESSIONS ?- Mild obstructive sleep apnea occurred during this study (AHI = 10.7/h). ?- Moderate oxygen desaturation was noted during this study (Min O2 = 76.0%). ?- The patient snored with moderate snoring volume. ?- No cardiac abnormalities were noted during this study. ?- Clinically significant periodic limb movements did not occur during sleep. No significant associated arousals. ? ?DIAGNOSIS ?- Obstructive Sleep Apnea (G47.33) ?- Nocturnal Hypoxemia (G47.36) ?- Atrial Fibrillation ? ?RECOMMENDATIONS ?- Therapeutic CPAP titration to determine optimal pressure required to alleviate sleep disordered breathing. ?- Positional therapy avoiding supine position during sleep. ?- Avoid alcohol, sedatives and other CNS depressants that may worsen sleep apnea and disrupt normal sleep architecture. ?- Sleep hygiene should be reviewed to assess factors that may improve sleep quality. ?- Weight management and regular exercise should be initiated or continued if appropriate. ? ?[Electronically signed] 11/07/2021 09:27 PM ? ?Fransico Him MD, ABSM ?Diplomate, Tax adviser of Sleep Medicine ?NPI: [id number] ?

## 2021-11-08 ENCOUNTER — Other Ambulatory Visit: Payer: BC Managed Care – PPO

## 2021-11-10 ENCOUNTER — Other Ambulatory Visit: Payer: BC Managed Care – PPO | Admitting: *Deleted

## 2021-11-10 DIAGNOSIS — I4819 Other persistent atrial fibrillation: Secondary | ICD-10-CM

## 2021-11-10 DIAGNOSIS — Z01812 Encounter for preprocedural laboratory examination: Secondary | ICD-10-CM | POA: Diagnosis not present

## 2021-11-10 LAB — BASIC METABOLIC PANEL
BUN/Creatinine Ratio: 10 (ref 10–24)
BUN: 9 mg/dL (ref 8–27)
CO2: 22 mmol/L (ref 20–29)
Calcium: 9 mg/dL (ref 8.6–10.2)
Chloride: 104 mmol/L (ref 96–106)
Creatinine, Ser: 0.89 mg/dL (ref 0.76–1.27)
Glucose: 97 mg/dL (ref 70–99)
Potassium: 4 mmol/L (ref 3.5–5.2)
Sodium: 139 mmol/L (ref 134–144)
eGFR: 97 mL/min/{1.73_m2} (ref >59)

## 2021-11-10 LAB — CBC
Hematocrit: 43.1 % (ref 37.5–51.0)
Hemoglobin: 15.1 g/dL (ref 13.0–17.7)
MCH: 29.5 pg (ref 26.6–33.0)
MCHC: 35 g/dL (ref 31.5–35.7)
MCV: 84 fL (ref 79–97)
Platelets: 243 10*3/uL (ref 150–450)
RBC: 5.12 x10E6/uL (ref 4.14–5.80)
RDW: 13.2 % (ref 11.6–15.4)
WBC: 7.1 10*3/uL (ref 3.4–10.8)

## 2021-11-17 ENCOUNTER — Other Ambulatory Visit: Payer: Self-pay | Admitting: *Deleted

## 2021-11-17 DIAGNOSIS — I4819 Other persistent atrial fibrillation: Secondary | ICD-10-CM

## 2021-11-17 MED ORDER — APIXABAN 5 MG PO TABS: 5.0000 mg | ORAL_TABLET | Freq: Two times a day (BID) | ORAL | 1 refills | Status: DC

## 2021-11-17 NOTE — Telephone Encounter (Signed)
Eliquis '5mg'$  paper refill request received. Patient is 61 years old, weight-122.5kg, Crea-0.89 on 11/10/21, Diagnosis-Afib, and last seen by Dr. Curt Bears on 10/09/2021. Dose is appropriate based on dosing criteria. Will send in refill to requested pharmacy.

## 2021-11-22 ENCOUNTER — Telehealth: Payer: Self-pay | Admitting: *Deleted

## 2021-11-22 ENCOUNTER — Telehealth (HOSPITAL_COMMUNITY): Payer: Self-pay | Admitting: Emergency Medicine

## 2021-11-22 DIAGNOSIS — G4733 Obstructive sleep apnea (adult) (pediatric): Secondary | ICD-10-CM

## 2021-11-22 NOTE — Telephone Encounter (Signed)
-----   Message from Lauralee Evener, [place] sent at 11/09/2021  8:01 AM EDT -----  ----- Message ----- From: Sueanne Margarita, MD Sent: 11/07/2021   9:28 PM EDT To: Cv Div Sleep Studies  Please let patient know that they have sleep apnea.  Recommend therapeutic CPAP titration for treatment of patient's sleep disordered breathing.  If unable to perform an in lab titration then initiate ResMed auto CPAP from 4 to 15cm H2O with heated humidity and mask of choice and overnight pulse ox on CPAP.

## 2021-11-22 NOTE — Telephone Encounter (Signed)
Reaching out to patient to offer assistance regarding upcoming cardiac imaging study; pt verbalizes understanding of appt date/time, parking situation and where to check in, pre-test NPO status and medications ordered, and verified current allergies; name and call back number provided for further questions should they arise Marchia Bond RN Navigator Cardiac Imaging [facility] [phone] office [phone] cell  Denies iv issues '50mg'$  metoprolol tartrate Arrival 730

## 2021-11-22 NOTE — Telephone Encounter (Signed)
The patient has been notified of the result and verbalized understanding.  All questions (if any) were answered. Marolyn Hammock, CMA 9/32/3557 3:22 PM    Precert Titration

## 2021-11-23 ENCOUNTER — Ambulatory Visit (HOSPITAL_COMMUNITY)
Admission: RE | Admit: 2021-11-23 | Discharge: 2021-11-23 | Disposition: A | Discharge status: Home / Self Care | Payer: BC Managed Care – PPO | Source: Ambulatory Visit | Attending: Cardiology | Admitting: Cardiology

## 2021-11-23 DIAGNOSIS — I4819 Other persistent atrial fibrillation: Secondary | ICD-10-CM | POA: Insufficient documentation

## 2021-11-23 MED ORDER — IOHEXOL 350 MG/ML SOLN
100.0000 mL | Freq: Once | INTRAVENOUS | Status: AC | PRN
  Administered 2021-11-23: 100 mL via INTRAVENOUS

## 2021-11-28 ENCOUNTER — Telehealth: Payer: Self-pay | Admitting: Cardiology

## 2021-11-28 NOTE — Telephone Encounter (Signed)
Spoke with patient who states he has questions regarding preparation for his ablation coming up on 11/30/21.  Patient specifically asked if there are any medications he needs to hold prior to procedure, any restrictions on food/drink intake or other special consideration pre-procedure.  Informed patient that Dr. Curt Bears and his nurse are out of the office today, will forward message to Dr. Macky Lower nurse to review and call patient back. Patient verbalized understanding.

## 2021-11-28 NOTE — Telephone Encounter (Signed)
Patient called to talk with Dr. Curt Bears or nurse about his Ablation that he is having on 11/30/21. Please call back to discuss with patient

## 2021-11-29 NOTE — Telephone Encounter (Signed)
Returned pt call. Questions answered -- NPO after MN, hold all medications tomorrow morning.   Patient verbalized understanding and agreeable to plan.

## 2021-11-29 NOTE — Pre-Procedure Instructions (Signed)
Instructed patient on the following items: Arrival time 1130 Nothing to eat or drink after midnight No meds AM of procedure Responsible person to drive you home and stay with you for 24 hrs  Have you missed any doses of anti-coagulant Eliquis- hasn't missed any doses    

## 2021-11-30 ENCOUNTER — Encounter (HOSPITAL_COMMUNITY)
Admission: RE | Disposition: A | Discharge status: Home / Self Care | Payer: BC Managed Care – PPO | Source: Home / Self Care | Attending: Cardiology

## 2021-11-30 ENCOUNTER — Ambulatory Visit (HOSPITAL_COMMUNITY): Payer: BC Managed Care – PPO | Admitting: Certified Registered Nurse Anesthetist

## 2021-11-30 ENCOUNTER — Other Ambulatory Visit: Payer: Self-pay

## 2021-11-30 ENCOUNTER — Ambulatory Visit (HOSPITAL_COMMUNITY)
Admission: RE | Admit: 2021-11-30 | Discharge: 2021-11-30 | Disposition: A | Discharge status: Home / Self Care | Payer: BC Managed Care – PPO | Attending: Cardiology | Admitting: Cardiology

## 2021-11-30 DIAGNOSIS — I10 Essential (primary) hypertension: Secondary | ICD-10-CM | POA: Insufficient documentation

## 2021-11-30 DIAGNOSIS — I4819 Other persistent atrial fibrillation: Secondary | ICD-10-CM | POA: Insufficient documentation

## 2021-11-30 DIAGNOSIS — I4891 Unspecified atrial fibrillation: Secondary | ICD-10-CM | POA: Diagnosis not present

## 2021-11-30 DIAGNOSIS — I2510 Atherosclerotic heart disease of native coronary artery without angina pectoris: Secondary | ICD-10-CM | POA: Diagnosis not present

## 2021-11-30 DIAGNOSIS — J449 Chronic obstructive pulmonary disease, unspecified: Secondary | ICD-10-CM | POA: Diagnosis not present

## 2021-11-30 DIAGNOSIS — E785 Hyperlipidemia, unspecified: Secondary | ICD-10-CM | POA: Diagnosis not present

## 2021-11-30 DIAGNOSIS — Z7901 Long term (current) use of anticoagulants: Secondary | ICD-10-CM | POA: Diagnosis not present

## 2021-11-30 HISTORY — PX: ATRIAL FIBRILLATION ABLATION: EP1191

## 2021-11-30 LAB — POCT ACTIVATED CLOTTING TIME
Activated Clotting Time: 263 seconds
Activated Clotting Time: 305 seconds

## 2021-11-30 SURGERY — ATRIAL FIBRILLATION ABLATION
Anesthesia: General

## 2021-11-30 MED ORDER — SODIUM CHLORIDE 0.9 % IV SOLN: INTRAVENOUS | Status: DC

## 2021-11-30 MED ORDER — HEPARIN (PORCINE) IN NACL 1000-0.9 UT/500ML-% IV SOLN
INTRAVENOUS | Status: AC
  Filled 2021-11-30: qty 500

## 2021-11-30 MED ORDER — DEXAMETHASONE SODIUM PHOSPHATE 10 MG/ML IJ SOLN
INTRAMUSCULAR | Status: DC | PRN
  Administered 2021-11-30: 10 mg via INTRAVENOUS

## 2021-11-30 MED ORDER — SODIUM CHLORIDE 0.9% FLUSH: 3.0000 mL | INTRAVENOUS | Status: DC | PRN

## 2021-11-30 MED ORDER — ROCURONIUM BROMIDE 10 MG/ML (PF) SYRINGE
PREFILLED_SYRINGE | INTRAVENOUS | Status: DC | PRN
  Administered 2021-11-30: 60 mg via INTRAVENOUS

## 2021-11-30 MED ORDER — ACETAMINOPHEN 500 MG PO TABS
1000.0000 mg | ORAL_TABLET | Freq: Once | ORAL | Status: AC
Start: 2021-11-30 — End: 2021-11-30
  Administered 2021-11-30: 1000 mg via ORAL
  Filled 2021-11-30: qty 2

## 2021-11-30 MED ORDER — SUGAMMADEX SODIUM 200 MG/2ML IV SOLN
INTRAVENOUS | Status: DC | PRN
  Administered 2021-11-30: 200 mg via INTRAVENOUS

## 2021-11-30 MED ORDER — FENTANYL CITRATE (PF) 250 MCG/5ML IJ SOLN
INTRAMUSCULAR | Status: DC | PRN
  Administered 2021-11-30: 100 ug via INTRAVENOUS

## 2021-11-30 MED ORDER — DOBUTAMINE INFUSION FOR EP/ECHO/NUC (1000 MCG/ML)
INTRAVENOUS | Status: AC
  Filled 2021-11-30: qty 250

## 2021-11-30 MED ORDER — PROPOFOL 10 MG/ML IV BOLUS
INTRAVENOUS | Status: DC | PRN
  Administered 2021-11-30: 170 mg via INTRAVENOUS
  Administered 2021-11-30: 30 mg via INTRAVENOUS

## 2021-11-30 MED ORDER — PHENYLEPHRINE HCL-NACL 20-0.9 MG/250ML-% IV SOLN
INTRAVENOUS | Status: DC | PRN
  Administered 2021-11-30: 25 ug/min via INTRAVENOUS

## 2021-11-30 MED ORDER — PROTAMINE SULFATE 10 MG/ML IV SOLN
INTRAVENOUS | Status: DC | PRN
  Administered 2021-11-30 (×2): 20 mg via INTRAVENOUS

## 2021-11-30 MED ORDER — ONDANSETRON HCL 4 MG/2ML IJ SOLN: 4.0000 mg | Freq: Four times a day (QID) | INTRAMUSCULAR | Status: DC | PRN

## 2021-11-30 MED ORDER — DOBUTAMINE INFUSION FOR EP/ECHO/NUC (1000 MCG/ML)
INTRAVENOUS | Status: DC | PRN
  Administered 2021-11-30: 10 ug/kg/min via INTRAVENOUS

## 2021-11-30 MED ORDER — LIDOCAINE 2% (20 MG/ML) 5 ML SYRINGE
INTRAMUSCULAR | Status: DC | PRN
  Administered 2021-11-30: 60 mg via INTRAVENOUS

## 2021-11-30 MED ORDER — PHENYLEPHRINE HCL (PRESSORS) 10 MG/ML IV SOLN
INTRAVENOUS | Status: DC | PRN
  Administered 2021-11-30 (×3): 80 ug via INTRAVENOUS

## 2021-11-30 MED ORDER — ACETAMINOPHEN 325 MG PO TABS
650.0000 mg | ORAL_TABLET | ORAL | Status: DC | PRN
Start: 2021-11-30 — End: 2021-11-30

## 2021-11-30 MED ORDER — HEPARIN (PORCINE) IN NACL 1000-0.9 UT/500ML-% IV SOLN
INTRAVENOUS | Status: DC | PRN
  Administered 2021-11-30 (×5): 500 mL

## 2021-11-30 MED ORDER — ONDANSETRON HCL 4 MG/2ML IJ SOLN
INTRAMUSCULAR | Status: DC | PRN
  Administered 2021-11-30: 4 mg via INTRAVENOUS

## 2021-11-30 MED ORDER — HEPARIN SODIUM (PORCINE) 1000 UNIT/ML IJ SOLN
INTRAMUSCULAR | Status: DC | PRN
  Administered 2021-11-30: 1000 [IU] via INTRAVENOUS

## 2021-11-30 MED ORDER — SODIUM CHLORIDE 0.9 % IV SOLN
250.0000 mL | INTRAVENOUS | Status: DC | PRN
Start: 2021-11-30 — End: 2021-11-30

## 2021-11-30 MED ORDER — HEPARIN SODIUM (PORCINE) 1000 UNIT/ML IJ SOLN
INTRAMUSCULAR | Status: AC
  Filled 2021-11-30: qty 10

## 2021-11-30 MED ORDER — HEPARIN SODIUM (PORCINE) 1000 UNIT/ML IJ SOLN
INTRAMUSCULAR | Status: DC | PRN
  Administered 2021-11-30: 3000 [IU] via INTRAVENOUS
  Administered 2021-11-30: 6000 [IU] via INTRAVENOUS
  Administered 2021-11-30: 14000 [IU] via INTRAVENOUS

## 2021-11-30 MED ORDER — MIDAZOLAM HCL 5 MG/5ML IJ SOLN
INTRAMUSCULAR | Status: DC | PRN
  Administered 2021-11-30: 2 mg via INTRAVENOUS

## 2021-11-30 MED ORDER — TRAMADOL HCL 50 MG PO TABS
50.0000 mg | ORAL_TABLET | Freq: Once | ORAL | Status: AC
  Administered 2021-11-30: 50 mg via ORAL
  Filled 2021-11-30: qty 1

## 2021-11-30 SURGICAL SUPPLY — 22 items
BAG SNAP BAND KOVER 36X36 (MISCELLANEOUS) ×1 IMPLANT
BLANKET WARM UNDERBOD FULL ACC (MISCELLANEOUS) ×2 IMPLANT
CATH 8FR REPROCESSED SOUNDSTAR (CATHETERS) ×2 IMPLANT
CATH 8FR SOUNDSTAR REPROCESSED (CATHETERS) IMPLANT
CATH OCTARAY 2.0 F 3-3-3-3-3 (CATHETERS) ×1 IMPLANT
CATH S CIRCA THERM PROBE 10F (CATHETERS) ×1 IMPLANT
CATH SMTCH THERMOCOOL SF DF (CATHETERS) ×1 IMPLANT
CATH WEB BI DIR CSDF CRV REPRO (CATHETERS) ×1 IMPLANT
CLOSURE PERCLOSE PROSTYLE (VASCULAR PRODUCTS) ×4 IMPLANT
COVER SWIFTLINK CONNECTOR (BAG) ×2 IMPLANT
KIT VERSACROSS STEERABLE D1 (CATHETERS) ×1 IMPLANT
MAT PREVALON FULL STRYKER (MISCELLANEOUS) ×1 IMPLANT
PACK EP LATEX FREE (CUSTOM PROCEDURE TRAY) ×1
PACK EP LF (CUSTOM PROCEDURE TRAY) ×1 IMPLANT
PAD DEFIB RADIO PHYSIO CONN (PAD) ×2 IMPLANT
PATCH CARTO3 (PAD) ×1 IMPLANT
SHEATH CARTO VIZIGO SM CVD (SHEATH) ×1 IMPLANT
SHEATH PINNACLE 7F 10CM (SHEATH) ×1 IMPLANT
SHEATH PINNACLE 8F 10CM (SHEATH) ×2 IMPLANT
SHEATH PINNACLE 9F 10CM (SHEATH) ×1 IMPLANT
SHEATH PROBE COVER 6X72 (BAG) ×1 IMPLANT
TUBING SMART ABLATE COOLFLOW (TUBING) ×1 IMPLANT

## 2021-11-30 NOTE — Transfer of Care (Signed)
Immediate Anesthesia Transfer of Care Note  Patient: James Weeks  Procedure(s) Performed: ATRIAL FIBRILLATION ABLATION  Patient Location: Cath Lab  Anesthesia Type:Regional  Level of Consciousness: drowsy  Airway & Oxygen Therapy: Patient Spontanous Breathing and Patient connected to nasal cannula oxygen  Post-op Assessment: Report given to RN and Post -op Vital signs reviewed and stable  Post vital signs: Reviewed and stable  Last Vitals:  Vitals Value Taken Time  BP 105/67 11/30/21 1433  Temp    Pulse 74 11/30/21 1435  Resp 15 11/30/21 1435  SpO2 100 % 11/30/21 1435  Vitals shown include unvalidated device data.  Last Pain:  Vitals:   11/30/21 1205  TempSrc:   PainSc: 0-No pain         Complications: There were no known notable events for this encounter.

## 2021-11-30 NOTE — H&P (Signed)
Electrophysiology Office Note   Date:  11/30/2021   ID:  James Weeks, DOB [birth year], MRN [id number]  PCP:  Earlyne Iba, NP  Cardiologist:  Johney Frame Primary Electrophysiologist:  Zain Bingman Meredith Leeds, MD    Chief Complaint: AF   History of Present Illness: James Weeks is a 60 y.o. male who is being seen today for the evaluation of AF at the request of No ref. provider found. Presenting today for electrophysiology evaluation.  He has a history significant hypertension, hyperlipidemia, elevated PSA, atrial fibrillation.  He was seen March 2021 and was found to be in new onset atrial fibrillation.  He was asymptomatic and was started on Eliquis.  He had a cardioversion May 2021 with return to sinus rhythm.  Unfortunately has developed recurrent atrial fibrillation.  He is fatigued with quite a bit less energy.  He has chest tightness and palpitations.  Today, denies symptoms of palpitations, chest pain, shortness of breath, orthopnea, PND, lower extremity edema, claudication, dizziness, presyncope, syncope, bleeding, or neurologic sequela. The patient is tolerating medications without difficulties. Plan ablation today.    Past Medical History:  Diagnosis Date   Allergic rhinitis due to pollen    Anticoagulant long-term use    eliquis-- managed by cardiology   Anxiety    Emphysema lung ([place])    per chest CT in epic 09-29-2020   Hyperlipidemia    Hypertension    Mild CAD    cardiologist--- [name]---  per coronary CT 09-05-2018 mild cad involving midLAD, moderate torturous coronary arteries, calcium score =1   PAF (paroxysmal atrial fibrillation) [facility])    cardiologist-- [name]-- dx 03/ 2021 asymptomatic   Prostate cancer [facility])    urologist--- dr gay--- dx 04/ 2020 Gleason 3+3 active survillence;  bx 12/ 2020 ,Gleason3+4 survillence;  MRI fusion bx 04/ 2022 Gleason 3+4   Pulmonary nodules    last chest CT in epic 09-29-2020 bilateral upper lobe nodules  stable, benign   Past Surgical History:  Procedure Laterality Date   CARDIOVERSION N/A 10/30/2019   Procedure: CARDIOVERSION;  Surgeon: [name], MD;  Location: [place];  Service: Cardiovascular;  Laterality: N/A;   CARDIOVERSION N/A 09/26/2021   Procedure: CARDIOVERSION;  Surgeon: Josue Hector, MD;  Location: [place];  Service: Cardiovascular;  Laterality: N/A;   COLONOSCOPY WITH PROPOFOL  2012   PROSTATE BIOPSY     RADIOACTIVE SEED IMPLANT N/A 02/06/2021   Procedure: RADIOACTIVE SEED IMPLANT/BRACHYTHERAPY IMPLANT WITH CYSTOSCOPY;  Surgeon: [name], MD;  Location: [place];  Service: Urology;  Laterality: N/A;  74 seeds; No seeds detected in bladder per Dr. Abner Greenspan     No current facility-administered medications for this encounter.    Allergies:   Patient has no known allergies.   Social History:  The patient  reports that he has been smoking cigars and cigarettes. He quit smokeless tobacco use about 38 years ago.  His smokeless tobacco use included chew. He reports current alcohol use. He reports that he does not use drugs.   Family History:  The patient's family history includes Asthma in his brother and father; Breast cancer in his maternal aunt and mother; CVA in an other family member; Cancer in his maternal grandmother and paternal grandmother; Diabetes in his mother; Heart attack in an other family member; Heart disease in his father and another family member; Hypertension in his mother and another family member; Lung cancer in his maternal grandfather; Other (age of onset: 90)  in his father; Prostate cancer in his paternal grandfather and paternal uncle.   ROS:  Please see the history of present illness.   Otherwise, review of systems is positive for none.   All other systems are reviewed and negative.   PHYSICAL EXAM: VS:  There were no vitals taken for this visit. , BMI There is no height or weight on file to calculate BMI. GEN: Well  nourished, well developed, in no acute distress  HEENT: normal  Neck: no JVD, carotid bruits, or masses Cardiac: irregular; no murmurs, rubs, or gallops,no edema  Respiratory:  clear to auscultation bilaterally, normal work of breathing GI: soft, nontender, nondistended, + BS MS: no deformity or atrophy  Skin: warm and dry Neuro:  Strength and sensation are intact Psych: euthymic mood, full affect   Recent Labs: 02/02/2021: ALT 21 11/10/2021: BUN 9; Creatinine, Ser 0.89; Hemoglobin 15.1; Platelets 243; Potassium 4.0; Sodium 139    Lipid Panel     Component Value Date/Time   CHOL 198 01/05/2021 1030   TRIG 191 (H) 01/05/2021 1030   HDL 33 (L) 01/05/2021 1030   CHOLHDL 6.0 (H) 01/05/2021 1030   LDLCALC 131 (H) 01/05/2021 1030     Wt Readings from Last 3 Encounters:  11/03/21 122.5 kg  10/09/21 122.5 kg  09/13/21 124.6 kg      Other studies Reviewed: Additional studies/ records that were reviewed today include: TTE 09/23/19  Review of the above records today demonstrates:   1. Left ventricular ejection fraction, by estimation, is 50 to 55%. The  left ventricle has low normal function. The left ventricle has no regional  wall motion abnormalities. Left ventricular diastolic function could not  be evaluated.   2. Right ventricular systolic function is normal. The right ventricular  size is normal. The estimated right ventricular systolic pressure is 81.1  mmHg.   3. Left atrial size was mildly dilated.   4. The mitral valve is normal in structure. Trivial mitral valve  regurgitation. No evidence of mitral stenosis.   5. The aortic valve is normal in structure. Aortic valve regurgitation is  not visualized. Mild aortic valve sclerosis is present, with no evidence  of aortic valve stenosis.   6. The inferior vena cava is normal in size with greater than 50%  respiratory variability, suggesting right atrial pressure of 3 mmHg.    ASSESSMENT AND PLAN:  1.  1.  Persistent  atrial fibrillation: James Weeks has presented today for surgery, with the diagnosis of AF.  The various methods of treatment have been discussed with the patient and family. After consideration of risks, benefits and other options for treatment, the patient has consented to  Procedure(s): Catheter ablation as a surgical intervention .  Risks include but not limited to complete heart block, stroke, esophageal damage, nerve damage, bleeding, vascular damage, tamponade, perforation, MI, and death. The patient's history has been reviewed, patient examined, no change in status, stable for surgery.  I have reviewed the patient's chart and labs.  Questions were answered to the patient's satisfaction.    James Redwine Curt Bears, MD 11/30/2021 11:46 AM

## 2021-11-30 NOTE — Anesthesia Procedure Notes (Signed)
Procedure Name: Intubation Date/Time: 11/30/2021 12:42 PM  Performed by: Glynda Jaeger, CRNAPre-anesthesia Checklist: Patient identified, Patient being monitored, Timeout performed, Emergency Drugs available and Suction available Patient Re-evaluated:Patient Re-evaluated prior to induction Oxygen Delivery Method: Circle System Utilized Preoxygenation: Pre-oxygenation with 100% oxygen Induction Type: IV induction Ventilation: Mask ventilation without difficulty Laryngoscope Size: Glidescope and 4 Grade View: Grade I Tube type: Oral Tube size: 7.0 mm Number of attempts: 1 Airway Equipment and Method: Stylet Placement Confirmation: ETT inserted through vocal cords under direct vision, positive ETCO2 and breath sounds checked- equal and bilateral Secured at: 22 cm Tube secured with: Tape Dental Injury: Teeth and Oropharynx as per pre-operative assessment  Comments: DL with Mac 4, grade 3 view. VL with glidescope go 4 grade 1 view.

## 2021-11-30 NOTE — Anesthesia Preprocedure Evaluation (Signed)
Anesthesia Evaluation  Patient identified by MRN, date of birth, ID band Patient awake    Reviewed: Allergy & Precautions, NPO status , Patient's Chart, lab work & pertinent test results  Airway Mallampati: II  TM Distance: >3 FB Neck ROM: Full    Dental  (+) Dental Advisory Given   Pulmonary COPD, Current Smoker,    breath sounds clear to auscultation       Cardiovascular hypertension, Pt. on medications and Pt. on home beta blockers + CAD  + dysrhythmias Atrial Fibrillation  Rhythm:Regular Rate:Normal     Neuro/Psych negative neurological ROS     GI/Hepatic negative GI ROS, Neg liver ROS,   Endo/Other  negative endocrine ROS  Renal/GU negative Renal ROS     Musculoskeletal   Abdominal   Peds  Hematology negative hematology ROS (+)   Anesthesia Other Findings   Reproductive/Obstetrics                             Lab Results  Component Value Date   WBC 7.1 11/10/2021   HGB 15.1 11/10/2021   HCT 43.1 11/10/2021   MCV 84 11/10/2021   PLT 243 11/10/2021   Lab Results  Component Value Date   CREATININE 0.89 11/10/2021   BUN 9 11/10/2021   NA 139 11/10/2021   K 4.0 11/10/2021   CL 104 11/10/2021   CO2 22 11/10/2021    Anesthesia Physical Anesthesia Plan  ASA: 3  Anesthesia Plan: General   Post-op Pain Management: Tylenol PO (pre-op)* and Minimal or no pain anticipated   Induction: Intravenous  PONV Risk Score and Plan: 1 and Dexamethasone, Ondansetron and Treatment may vary due to age or medical condition  Airway Management Planned: Oral ETT  Additional Equipment: None  Intra-op Plan:   Post-operative Plan: Extubation in OR  Informed Consent: I have reviewed the patients History and Physical, chart, labs and discussed the procedure including the risks, benefits and alternatives for the proposed anesthesia with the patient or authorized representative who has  indicated his/her understanding and acceptance.     Dental advisory given  Plan Discussed with: CRNA  Anesthesia Plan Comments:         Anesthesia Quick Evaluation

## 2021-11-30 NOTE — Discharge Instructions (Addendum)
Cardiac Ablation, Care After  This sheet gives you information about how to care for yourself after your procedure. Your health care provider may also give you more specific instructions. If you have problems or questions, contact your health care provider. What can I expect after the procedure? After the procedure, it is common to have: Bruising around your puncture site. Tenderness around your puncture site. Skipped heartbeats. Tiredness (fatigue).  Follow these instructions at home: Puncture site care  Follow instructions from your health care provider about how to take care of your puncture site. Make sure you: If present, leave stitches (sutures), skin glue, or adhesive strips in place. These skin closures may need to stay in place for up to 2 weeks. If adhesive strip edges start to loosen and curl up, you may trim the loose edges. Do not remove adhesive strips completely unless your health care provider tells you to do that. If a large square bandage is present, this may be removed 24 hours after surgery.  Check your puncture site every day for signs of infection. Check for: Redness, swelling, or pain. Fluid or blood. If your puncture site starts to bleed, lie down on your back, apply firm pressure to the area, and contact your health care provider. Warmth. Pus or a bad smell. A pea or small marble sized lump at the site is normal and can take up to three months to resolve.  Driving Do not drive for at least 4 days after your procedure or however long your health care provider recommends. (Do not resume driving if you have previously been instructed not to drive for other health reasons.) Do not drive or use heavy machinery while taking prescription pain medicine. Activity Avoid activities that take a lot of effort for at least 7 days after your procedure. Do not lift anything that is heavier than 5 lb (4.5 kg) for one week.  No sexual activity for 1 week.  Return to your normal  activities as told by your health care provider. Ask your health care provider what activities are safe for you. General instructions Take over-the-counter and prescription medicines only as told by your health care provider. Do not use any products that contain nicotine or tobacco, such as cigarettes and e-cigarettes. If you need help quitting, ask your health care provider. You may shower after 24 hours, but Do not take baths, swim, or use a hot tub for 1 week.  Do not drink alcohol for 24 hours after your procedure. Keep all follow-up visits as told by your health care provider. This is important. Contact a health care provider if: You have redness, mild swelling, or pain around your puncture site. You have fluid or blood coming from your puncture site that stops after applying firm pressure to the area. Your puncture site feels warm to the touch. You have pus or a bad smell coming from your puncture site. You have a fever. You have chest pain or discomfort that spreads to your neck, jaw, or arm. You are sweating a lot. You feel nauseous. You have a fast or irregular heartbeat. You have shortness of breath. You are dizzy or light-headed and feel the need to lie down. You have pain or numbness in the arm or leg closest to your puncture site. Get help right away if: Your puncture site suddenly swells. Your puncture site is bleeding and the bleeding does not stop after applying firm pressure to the area. These symptoms may represent a serious problem that is   an emergency. Do not wait to see if the symptoms will go away. Get medical help right away. Call your local emergency services (911 in the [place] Do not drive yourself to the hospital. Summary After the procedure, it is normal to have bruising and tenderness at the puncture site in your groin, neck, or forearm. Check your puncture site every day for signs of infection. Get help right away if your puncture site is bleeding and the  bleeding does not stop after applying firm pressure to the area. This is a medical emergency. This information is not intended to replace advice given to you by your health care provider. Make sure you discuss any questions you have with your health care provider.  Post procedure care instructions No driving for 4 days. No lifting over 5 lbs for 1 week. No vigorous or sexual activity for 1 week. You may return to work/your usual activities on 12/08/21. Keep procedure site clean & dry. If you notice increased pain, swelling, bleeding or pus, call/return!  You may shower after 24 hours, but no soaking in baths/hot tubs/pools for 1 week.    You have an appointment set up with the [facility].  Multiple studies have shown that being followed by a dedicated atrial fibrillation clinic in addition to the standard care you receive from your other physicians improves health. We believe that enrollment in the atrial fibrillation clinic will allow [place] to better care for you.   The phone number to the [facility] is [phone]. The clinic is staffed Monday through Friday from 8:30am to 5pm.  Parking Directions: The clinic is located in the Heart and Vascular Building connected to [facility]. 1)From [address] turn on to [address] and go to the 3rd entrance  (Heart and Vascular entrance) on the right. 2)Look to the right for Heart &Vascular Parking Garage. 3)A code for the entrance is required, for July is 1404.   4)Take the elevators to the 1st floor. Registration is in the room with the glass walls at the end of the hallway.  If you have any trouble parking or locating the clinic, please don't hesitate to call [phone].

## 2021-12-01 ENCOUNTER — Encounter (HOSPITAL_COMMUNITY): Payer: Self-pay | Admitting: Cardiology

## 2021-12-01 NOTE — Anesthesia Postprocedure Evaluation (Signed)
Anesthesia Post Note  Patient: James Weeks  Procedure(s) Performed: ATRIAL FIBRILLATION ABLATION     Patient location during evaluation: PACU Anesthesia Type: General Level of consciousness: awake and alert Pain management: pain level controlled Vital Signs Assessment: post-procedure vital signs reviewed and stable Respiratory status: spontaneous breathing, nonlabored ventilation, respiratory function stable and patient connected to nasal cannula oxygen Cardiovascular status: blood pressure returned to baseline and stable Postop Assessment: no apparent nausea or vomiting Anesthetic complications: no   There were no known notable events for this encounter.  Last Vitals:  Vitals:   11/30/21 1630 11/30/21 1700  BP: 114/82 110/77  Pulse: 90 92  Resp: 17 15  Temp:    SpO2: 93% 91%    Last Pain:  Vitals:   11/30/21 1548  TempSrc:   PainSc: 5                  Tiajuana Amass

## 2021-12-11 DIAGNOSIS — R7303 Prediabetes: Secondary | ICD-10-CM | POA: Diagnosis not present

## 2021-12-11 DIAGNOSIS — F419 Anxiety disorder, unspecified: Secondary | ICD-10-CM | POA: Diagnosis not present

## 2021-12-11 DIAGNOSIS — Z125 Encounter for screening for malignant neoplasm of prostate: Secondary | ICD-10-CM | POA: Diagnosis not present

## 2021-12-11 DIAGNOSIS — E785 Hyperlipidemia, unspecified: Secondary | ICD-10-CM | POA: Diagnosis not present

## 2021-12-11 DIAGNOSIS — I10 Essential (primary) hypertension: Secondary | ICD-10-CM | POA: Diagnosis not present

## 2021-12-14 NOTE — Addendum Note (Signed)
Addended by: [name] on: 12/14/2021 05:12 PM   Modules accepted: Orders

## 2021-12-18 ENCOUNTER — Other Ambulatory Visit: Payer: Self-pay | Admitting: *Deleted

## 2021-12-18 MED ORDER — ROSUVASTATIN CALCIUM 10 MG PO TABS: 10.0000 mg | ORAL_TABLET | Freq: Every day | ORAL | 2 refills | Status: DC

## 2021-12-18 MED ORDER — AMLODIPINE BESYLATE 5 MG PO TABS: ORAL_TABLET | ORAL | 2 refills | Status: DC

## 2021-12-28 ENCOUNTER — Encounter (HOSPITAL_COMMUNITY): Payer: Self-pay | Admitting: Physician Assistant

## 2021-12-28 ENCOUNTER — Ambulatory Visit (HOSPITAL_COMMUNITY)
Admission: RE | Admit: 2021-12-28 | Discharge: 2021-12-28 | Disposition: A | Discharge status: Home / Self Care | Payer: BC Managed Care – PPO | Source: Ambulatory Visit | Attending: Physician Assistant | Admitting: Physician Assistant

## 2021-12-28 VITALS — BP 132/88 | HR 79 | Ht 73.0 in | Wt 275.4 lb

## 2021-12-28 DIAGNOSIS — I4819 Other persistent atrial fibrillation: Secondary | ICD-10-CM | POA: Diagnosis not present

## 2021-12-28 DIAGNOSIS — Z6836 Body mass index (BMI) 36.0-36.9, adult: Secondary | ICD-10-CM | POA: Insufficient documentation

## 2021-12-28 DIAGNOSIS — E669 Obesity, unspecified: Secondary | ICD-10-CM | POA: Insufficient documentation

## 2021-12-28 DIAGNOSIS — Z7901 Long term (current) use of anticoagulants: Secondary | ICD-10-CM | POA: Diagnosis not present

## 2021-12-28 DIAGNOSIS — E785 Hyperlipidemia, unspecified: Secondary | ICD-10-CM | POA: Insufficient documentation

## 2021-12-28 DIAGNOSIS — G4733 Obstructive sleep apnea (adult) (pediatric): Secondary | ICD-10-CM | POA: Insufficient documentation

## 2021-12-28 DIAGNOSIS — D6869 Other thrombophilia: Secondary | ICD-10-CM | POA: Diagnosis not present

## 2021-12-28 DIAGNOSIS — I2510 Atherosclerotic heart disease of native coronary artery without angina pectoris: Secondary | ICD-10-CM | POA: Insufficient documentation

## 2021-12-28 DIAGNOSIS — F17210 Nicotine dependence, cigarettes, uncomplicated: Secondary | ICD-10-CM | POA: Insufficient documentation

## 2021-12-28 DIAGNOSIS — I10 Essential (primary) hypertension: Secondary | ICD-10-CM | POA: Insufficient documentation

## 2021-12-28 NOTE — Progress Notes (Signed)
Primary Care Physician: Earlyne Iba, NP Primary Cardiologist: [name] Primary Electrophysiologist: Dr Curt Bears Referring Physician: Dr Nani Ravens James Weeks 61 y.o. male with Weeks history of HTN, HLD, CAD, OSA, HLD, atrial fibrillation who presents for consultation in the [facility]. He was seen March 2021 and was found to be in new onset atrial fibrillation.  He was asymptomatic and was started on Eliquis.  He had Weeks cardioversion May 2021 with return to sinus rhythm.  Unfortunately he developed recurrent atrial fibrillation. Patient is on Eliquis for Weeks CHADS2VASC score of 2. He underwent afib ablation with Dr Curt Bears on 11/30/21.   On follow up today, patient reports that he has done well since the ablation with no afib detected on his Apple Watch. He denies CP, swallowing pain, or groin issues. No bleeding issues on anticoagulation.   Today, he denies symptoms of palpitations, chest pain, shortness of breath, orthopnea, PND, lower extremity edema, dizziness, presyncope, syncope, snoring, daytime somnolence, bleeding, or neurologic sequela. The patient is tolerating medications without difficulties and is otherwise without complaint today.    Atrial Fibrillation Risk Factors:  he does have symptoms or diagnosis of sleep apnea. he is waiting for CPAP titration.  he does not have Weeks history of rheumatic fever.   he has Weeks BMI of Body mass index is 36.33 kg/m.Marland Kitchen Filed Weights   12/28/21 1126  Weight: 124.9 kg    Family History  Problem Relation Age of Onset   Hypertension Mother    Diabetes Mother    Breast cancer Mother    Heart disease Father    Asthma Father    Other Father 40       enlarged prostate   Asthma Brother    Hypertension Other    Heart disease Other    Heart attack Other    CVA Other    Breast cancer Maternal Aunt    Prostate cancer Paternal Uncle    Cancer Maternal Grandmother        type unknown   Lung cancer  Maternal Grandfather    Cancer Paternal Grandmother        cancer in lymph nodes in arm   Prostate cancer Paternal Grandfather    Colon cancer Neg Hx    Pancreatic cancer Neg Hx      Atrial Fibrillation Management history:  Previous antiarrhythmic drugs: none Previous cardioversions: 2021, 09/26/21 Previous ablations: 11/30/21 CHADS2VASC score: 2 Anticoagulation history: Eliquis   Past Medical History:  Diagnosis Date   Allergic rhinitis due to pollen    Anticoagulant long-term use    eliquis-- managed by cardiology   Anxiety    Emphysema lung ([place])    per chest CT in epic 09-29-2020   Hyperlipidemia    Hypertension    Mild CAD    cardiologist--- [name]---  per coronary CT 09-05-2018 mild cad involving midLAD, moderate torturous coronary arteries, calcium score =1   PAF (paroxysmal atrial fibrillation) [facility])    cardiologist-- [name]-- dx 03/ 2021 asymptomatic   Prostate cancer [facility])    urologist--- dr gay--- dx 04/ 2020 Gleason 3+3 active survillence;  bx 12/ 2020 ,Gleason3+4 survillence;  MRI fusion bx 04/ 2022 Gleason 3+4   Pulmonary nodules    last chest CT in epic 09-29-2020 bilateral upper lobe nodules stable, benign   Past Surgical History:  Procedure Laterality Date   ATRIAL FIBRILLATION ABLATION N/Weeks 11/30/2021   Procedure: ATRIAL FIBRILLATION ABLATION;  Surgeon:  Constance Haw, MD;  Location: [facility];  Service: Cardiovascular;  Laterality: N/Weeks;   CARDIOVERSION N/Weeks 10/30/2019   Procedure: CARDIOVERSION;  Surgeon: [name], MD;  Location: [facility];  Service: Cardiovascular;  Laterality: N/Weeks;   CARDIOVERSION N/Weeks 09/26/2021   Procedure: CARDIOVERSION;  Surgeon: Josue Hector, MD;  Location: [facility] ENDOSCOPY;  Service: Cardiovascular;  Laterality: N/Weeks;   COLONOSCOPY WITH PROPOFOL  2012   PROSTATE BIOPSY     RADIOACTIVE SEED IMPLANT N/Weeks 02/06/2021   Procedure: RADIOACTIVE SEED IMPLANT/BRACHYTHERAPY IMPLANT WITH CYSTOSCOPY;  Surgeon: [name], MD;  Location: [facility];  Service: Urology;  Laterality: N/Weeks;  74 seeds; No seeds detected in bladder per Dr. Abner Greenspan    Current Outpatient Medications  Medication Sig Dispense Refill   acetaminophen (TYLENOL) 325 MG tablet Take 650 mg by mouth every 6 (six) hours as needed for moderate pain.     amLODipine (NORVASC) 5 MG tablet TAKE 1 TABLET BY MOUTH EVERYDAY AT BEDTIME 90 tablet 2   apixaban (ELIQUIS) 5 MG TABS tablet Take 1 tablet (5 mg total) by mouth 2 (two) times daily. 180 tablet 1   diazepam (VALIUM) 10 MG tablet Take 5 mg by mouth daily as needed for anxiety.      fluticasone (FLONASE) 50 MCG/ACT nasal spray Place 50 mcg into the nose daily as needed for allergies or rhinitis.     loratadine (CLARITIN) 10 MG tablet Take 10 mg by mouth daily as needed for allergies.     metoprolol succinate (TOPROL-XL) 100 MG 24 hr tablet Take 1 tablet (100 mg total) by mouth at bedtime. 90 tablet 0   rosuvastatin (CRESTOR) 10 MG tablet Take 1 tablet (10 mg total) by mouth daily. 90 tablet 2   tamsulosin (FLOMAX) 0.4 MG CAPS capsule Take 0.4 mg by mouth at bedtime.     No current facility-administered medications for this encounter.    No Known Allergies  Social History   Socioeconomic History   Marital status: Married    Spouse name: Not on file   Number of children: 2   Years of education: Not on file   Highest education level: Not on file  Occupational History   Not on file  Tobacco Use   Smoking status: Some Days    Years: 20.00    Types: Cigars, Cigarettes   Smokeless tobacco: Former    Types: Chew    Quit date: 1985   Tobacco comments:    02-02-2021  pt quit cigarette smoking 2012, has continued occasional cigar smoking  Vaping Use   Vaping Use: Never used  Substance and Sexual Activity   Alcohol use: Yes    Comment: occasionally   Drug use: Never   Sexual activity: Yes  Other Topics Concern   Not on file  Social History Narrative   Not on file    Social Determinants of Health   Financial Resource Strain: Low Risk  (04/27/2021)   Overall Financial Resource Strain (CARDIA)    Difficulty of Paying Living Expenses: Not hard at all  Food Insecurity: No Food Insecurity (04/27/2021)   Hunger Vital Sign    Worried About Running Out of Food in the Last Year: Never true    [place] in the Last Year: Never true  Transportation Needs: No Transportation Needs (04/27/2021)   PRAPARE - Hydrologist (Medical): No    Lack of Transportation (Non-Medical): No  Physical Activity: Sufficiently  Active (04/27/2021)   Exercise Vital Sign    Days of Exercise per Week: 5 days    Minutes of Exercise per Session: 60 min  Stress: No Stress Concern Present (04/27/2021)   [place]    Feeling of Stress : Only Weeks little  Social Connections: Moderately Isolated (04/27/2021)   Social Connection and Isolation Panel [NHANES]    Frequency of Communication with Friends and Family: More than three times Weeks week    Frequency of Social Gatherings with Friends and Family: Twice Weeks week    Attends Religious Services: Never    Marine scientist or Organizations: No    Attends Archivist Meetings: Never    Marital Status: Married  Human resources officer Violence: Not At Risk (04/27/2021)   Humiliation, Afraid, Rape, and Kick questionnaire    Fear of Current or Ex-Partner: No    Emotionally Abused: No    Physically Abused: No    Sexually Abused: No     ROS- All systems are reviewed and negative except as per the HPI above.  Physical Exam: Vitals:   12/28/21 1126  BP: 132/88  Pulse: 79  Weight: 124.9 kg  Height: '6\' 1"'$  (1.854 m)    GEN- The patient is Weeks well appearing obese male, alert and oriented x 3 today.   Head- normocephalic, atraumatic Eyes-  Sclera clear, conjunctiva pink Ears- hearing intact Oropharynx- clear Neck- supple  Lungs- Clear to  ausculation bilaterally, normal work of breathing Heart- Regular rate and rhythm, no murmurs, rubs or gallops  GI- soft, NT, ND, + BS Extremities- no clubbing, cyanosis, or edema MS- no significant deformity or atrophy Skin- no rash or lesion Psych- euthymic mood, full affect Neuro- strength and sensation are intact  Wt Readings from Last 3 Encounters:  12/28/21 124.9 kg  11/30/21 122.5 kg  11/03/21 122.5 kg    EKG today demonstrates  SR with short PR vs ectopic rhythm Vent. rate 79 BPM PR interval 92 ms QRS duration 84 ms QT/QTcB 388/444 ms  Echo 09/23/18 demonstrated   1. Left ventricular ejection fraction, by estimation, is 50 to 55%. The  left ventricle has low normal function. The left ventricle has no regional  wall motion abnormalities. Left ventricular diastolic function could not  be evaluated.   2. Right ventricular systolic function is normal. The right ventricular  size is normal. The estimated right ventricular systolic pressure is 44.3  mmHg.   3. Left atrial size was mildly dilated.   4. The mitral valve is normal in structure. Trivial mitral valve  regurgitation. No evidence of mitral stenosis.   5. The aortic valve is normal in structure. Aortic valve regurgitation is  not visualized. Mild aortic valve sclerosis is present, with no evidence  of aortic valve stenosis.   6. The inferior vena cava is normal in size with greater than 50%  respiratory variability, suggesting right atrial pressure of 3 mmHg.   Epic records are reviewed at length today  CHA2DS2-VASc Score = 2  The patient's score is based upon: CHF History: 0 HTN History: 1 Diabetes History: 0 Stroke History: 0 Vascular Disease History: 1 Age Score: 0 Gender Score: 0       ASSESSMENT AND PLAN: 1. Persistent Atrial Fibrillation (ICD10:  I48.19) The patient's CHA2DS2-VASc score is 2, indicating Weeks 2.2% annual risk of stroke.   S/p afib ablation 11/30/21 Continue Eliquis 5 mg BID with no  missed doses for 3  months post ablation. Continue Toprol 100 mg daily  2. Secondary Hypercoagulable State (ICD10:  D68.69) The patient is at significant risk for stroke/thromboembolism based upon his CHA2DS2-VASc Score of 2.  Continue Apixaban (Eliquis).   3. Obesity Body mass index is 36.33 kg/m. Lifestyle modification was discussed at length including regular exercise and weight reduction.  4. Obstructive sleep apnea The importance of adequate treatment of sleep apnea was discussed today in order to improve our ability to maintain sinus rhythm long term. Patient waiting on sleep medicine to schedule CPAP titration.   5. CAD CAC score 20 on CT 11/23/21 On statin No anginal symptoms.  6. HTN Stable, no changes today.   Follow up with Dr Curt Bears as scheduled.    [facility] [address] [place], [place] [address] [phone] 12/28/2021 11:59 AM

## 2021-12-28 NOTE — Telephone Encounter (Signed)
Prior Authorization for TITRATION sent to [facility] via Phone. Reference #  [id number]  NO PA REQ- REF #[id number] R. 12/28/21

## 2022-01-10 ENCOUNTER — Other Ambulatory Visit: Payer: Self-pay

## 2022-01-10 MED ORDER — ROSUVASTATIN CALCIUM 10 MG PO TABS: 10.0000 mg | ORAL_TABLET | Freq: Every day | ORAL | 2 refills | Status: DC

## 2022-01-10 NOTE — Telephone Encounter (Signed)
[facility] [phone], is requesting if pt could take medication Rosuvastatin 10 mg tablet, 1/2 tablet daily because medication is on backorder, until medication is back on market. Would Dr. Johney Frame like to change this medication? Please address

## 2022-01-12 ENCOUNTER — Telehealth: Payer: Self-pay

## 2022-01-12 MED ORDER — ROSUVASTATIN CALCIUM 20 MG PO TABS: 10.0000 mg | ORAL_TABLET | Freq: Every day | ORAL | 1 refills | Status: DC

## 2022-01-12 NOTE — Telephone Encounter (Signed)
[facility] in [place] [address] is stating that pt's medication Rosuvastatin 10 mg tablets is on backorder and can pt take 1/2 tablet of the 20 mg until medication is back on the market? Please address

## 2022-01-12 NOTE — Telephone Encounter (Signed)
Spoke w/ pharmacy. Will send in Rx for 20 mg, pt to take half tablet. Left detailed message informing pt of change, advised pt to let us know once 10 mg are back in stock and we can send in updated Rx.

## 2022-01-19 NOTE — Progress Notes (Unsigned)
Cardiology Office Note:    Date:  01/22/2022   ID:  James Weeks, DOB Dec 07, 1960, MRN [id number]  PCP:  Earlyne Iba, NP   [facility] Providers Cardiologist:  [name], MD Electrophysiologist:  Will Meredith Leeds, MD     Referring MD: [name], MD   Chief Complaint: follow-up hypertension, CAD  History of Present Illness:    James Weeks is a very pleasant 61 y.o. male with a hx of mild nonobstructive CAD, hypertension, hyperlipidemia, elevated PSA, atrial fibrillation, morbid obesity and sleep apnea.   Seen in the past by [name], NP for hypertension. Coronary CTA revealed mild CAD of mid LAD with calcium score of 1 (35% for age/sex). Normal coronary origin with right dominance, moderately tortuous coronary arteries. Found to be in new onset atrial fibrillation March 2021. Was asymptomatic and started on Eliquis. Did not want to pursue anti-arrhythmic therapy. Had DCCV May 2021 with return to sinus rhythm. Unfortunately developed recurrent atrial fibrillation. Symptomatic with fatigue and quite a bit less energy, chest tightness and palpitations. Cardioversion April 2023 with return to atrial fibrillation after 3 days. Referred to Dr. Curt Bears for a fib ablation which was completed 11/30/21.   He was last seen by Adline Peals, PA in [facility] on 12/28/2021 at which time he was in sinus rhythm at 79 bpm, continuing Eliquis for 3 months post ablation.  Today, he is here alone for follow-up.  Reports that he is feeling great since the a fib ablation in June. Monitors heart rhythm on Apple Watch, no return of a fib to his awareness. Resting HR 50s-65 bpm. Occasional chest pain on both left and right side that occur randomly, do not last long. No palpitations, SOB, edema, PND, or orthopnea. Is working on increasing activity now that he is feeling better and has been cleared to proceed s/p ablation. Continues Eliquis as directed, no bleeding concerns. Is  awaiting   Past Medical History:  Diagnosis Date   Allergic rhinitis due to pollen    Anticoagulant long-term use    eliquis-- managed by cardiology   Anxiety    Emphysema lung ([place])    per chest CT in epic 09-29-2020   Hyperlipidemia    Hypertension    Mild CAD    cardiologist--- [name]---  per coronary CT 09-05-2018 mild cad involving midLAD, moderate torturous coronary arteries, calcium score =1   PAF (paroxysmal atrial fibrillation) [facility])    cardiologist-- [name]-- dx 03/ 2021 asymptomatic   Prostate cancer [facility])    urologist--- dr gay--- dx 04/ 2020 Gleason 3+3 active survillence;  bx 12/ 2020 ,Gleason3+4 survillence;  MRI fusion bx 04/ 2022 Gleason 3+4   Pulmonary nodules    last chest CT in epic 09-29-2020 bilateral upper lobe nodules stable, benign    Past Surgical History:  Procedure Laterality Date   ATRIAL FIBRILLATION ABLATION N/A 11/30/2021   Procedure: ATRIAL FIBRILLATION ABLATION;  Surgeon: Constance Haw, MD;  Location: [place] [facility];  Service: Cardiovascular;  Laterality: N/A;   CARDIOVERSION N/A 10/30/2019   Procedure: CARDIOVERSION;  Surgeon: [name], MD;  Location: [place];  Service: Cardiovascular;  Laterality: N/A;   CARDIOVERSION N/A 09/26/2021   Procedure: CARDIOVERSION;  Surgeon: Josue Hector, MD;  Location: [facility] ENDOSCOPY;  Service: Cardiovascular;  Laterality: N/A;   COLONOSCOPY WITH PROPOFOL  2012   PROSTATE BIOPSY     RADIOACTIVE SEED IMPLANT N/A 02/06/2021   Procedure: RADIOACTIVE SEED IMPLANT/BRACHYTHERAPY IMPLANT  WITH CYSTOSCOPY;  Surgeon: [name], MD;  Location: [facility];  Service: Urology;  Laterality: N/A;  74 seeds; No seeds detected in bladder per Dr. Abner Greenspan    Current Medications: Current Meds  Medication Sig   acetaminophen (TYLENOL) 325 MG tablet Take 650 mg by mouth every 6 (six) hours as needed for moderate pain.   amLODipine (NORVASC) 5 MG tablet TAKE 1 TABLET BY MOUTH EVERYDAY AT  BEDTIME   apixaban (ELIQUIS) 5 MG TABS tablet Take 1 tablet (5 mg total) by mouth 2 (two) times daily.   diazepam (VALIUM) 10 MG tablet Take 5 mg by mouth daily as needed for anxiety.    fluticasone (FLONASE) 50 MCG/ACT nasal spray Place 50 mcg into the nose daily as needed for allergies or rhinitis.   loratadine (CLARITIN) 10 MG tablet Take 10 mg by mouth daily as needed for allergies.   metoprolol succinate (TOPROL-XL) 100 MG 24 hr tablet Take 1 tablet (100 mg total) by mouth at bedtime.   rosuvastatin (CRESTOR) 20 MG tablet Take 0.5 tablets (10 mg total) by mouth daily.   tamsulosin (FLOMAX) 0.4 MG CAPS capsule Take 0.4 mg by mouth at bedtime.     Allergies:   Patient has no known allergies.   Social History   Socioeconomic History   Marital status: Married    Spouse name: Not on file   Number of children: 2   Years of education: Not on file   Highest education level: Not on file  Occupational History   Not on file  Tobacco Use   Smoking status: Some Days    Years: 20.00    Types: Cigars, Cigarettes   Smokeless tobacco: Former    Types: Chew    Quit date: 1985   Tobacco comments:    02-02-2021  pt quit cigarette smoking 2012, has continued occasional cigar smoking  Vaping Use   Vaping Use: Never used  Substance and Sexual Activity   Alcohol use: Yes    Comment: occasionally   Drug use: Never   Sexual activity: Yes  Other Topics Concern   Not on file  Social History Narrative   Not on file   Social Determinants of Health   Financial Resource Strain: Low Risk  (04/27/2021)   Overall Financial Resource Strain (CARDIA)    Difficulty of Paying Living Expenses: Not hard at all  Food Insecurity: No Food Insecurity (04/27/2021)   Hunger Vital Sign    Worried About Running Out of Food in the Last Year: Never true    [place] in the Last Year: Never true  Transportation Needs: No Transportation Needs (04/27/2021)   PRAPARE - Hydrologist  (Medical): No    Lack of Transportation (Non-Medical): No  Physical Activity: Sufficiently Active (04/27/2021)   Exercise Vital Sign    Days of Exercise per Week: 5 days    Minutes of Exercise per Session: 60 min  Stress: No Stress Concern Present (04/27/2021)   [place]    Feeling of Stress : Only a little  Social Connections: Moderately Isolated (04/27/2021)   Social Connection and Isolation Panel [NHANES]    Frequency of Communication with Friends and Family: More than three times a week    Frequency of Social Gatherings with Friends and Family: Twice a week    Attends Religious Services: Never    Marine scientist or Organizations: No  Attends Archivist Meetings: Never    Marital Status: Married     Family History: The patient's family history includes Asthma in his brother and father; Breast cancer in his maternal aunt and mother; CVA in an other family member; Cancer in his maternal grandmother and paternal grandmother; Diabetes in his mother; Heart attack in an other family member; Heart disease in his father and another family member; Hypertension in his mother and another family member; Lung cancer in his maternal grandfather; Other (age of onset: 37) in his father; Prostate cancer in his paternal grandfather and paternal uncle. There is no history of Colon cancer or Pancreatic cancer.  ROS:   Please see the history of present illness.  All other systems reviewed and are negative.  Labs/Other Studies Reviewed:    The following studies were reviewed today:  Atrial Fib Ablation 11/30/21  CONCLUSIONS: 1. Atrial fibrillation upon presentation.   2. Successful electrical isolation and anatomical encircling of all four pulmonary veins with radiofrequency current.  A WACA approach was used 3. Additional left atrial ablation was performed with a standard box lesion created along the posterior wall of  the left atrium 4. Atrial fibrillation successfully cardioverted to sinus rhythm. 5. No early apparent complications.   Coronary CTA 11/23/21  1. There is normal pulmonary vein drainage into the left atrium with ostial measurements above.   2. There is no thrombus in the left atrial appendage. Accessory left atrial appendage noted.   3. The esophagus runs in the left atrial midline and is not in proximity to any of the pulmonary vein ostia.   4. No PFO/ASD.   5. Normal coronary origin. Right dominance.   6. CAC score of 20, which is 43rd percentile for age-, race-, and sex-matched controls.   Echo 09/23/19   1. Left ventricular ejection fraction, by estimation, is 50 to 55%. The  left ventricle has low normal function. The left ventricle has no regional  wall motion abnormalities. Left ventricular diastolic function could not  be evaluated.   2. Right ventricular systolic function is normal. The right ventricular  size is normal. The estimated right ventricular systolic pressure is 16.1  mmHg.   3. Left atrial size was mildly dilated.   4. The mitral valve is normal in structure. Trivial mitral valve  regurgitation. No evidence of mitral stenosis.   5. The aortic valve is normal in structure. Aortic valve regurgitation is  not visualized. Mild aortic valve sclerosis is present, with no evidence  of aortic valve stenosis.   6. The inferior vena cava is normal in size with greater than 50%  respiratory variability, suggesting right atrial pressure of 3 mmHg.   Comparison(s): No prior Echocardiogram.    Recent Labs: 02/02/2021: ALT 21 11/10/2021: BUN 9; Creatinine, Ser 0.89; Hemoglobin 15.1; Platelets 243; Potassium 4.0; Sodium 139  Recent Lipid Panel    Component Value Date/Time   CHOL 198 01/05/2021 1030   TRIG 191 (H) 01/05/2021 1030   HDL 33 (L) 01/05/2021 1030   CHOLHDL 6.0 (H) 01/05/2021 1030   LDLCALC 131 (H) 01/05/2021 1030     Risk Assessment/Calculations:     CHA2DS2-VASc Score = 2  {This indicates a 2.2% annual risk of stroke. The patient's score is based upon: CHF History: 0 HTN History: 1 Diabetes History: 0 Stroke History: 0 Vascular Disease History: 1 Age Score: 0 Gender Score: 0   Physical Exam:    VS:  BP 130/82   Pulse 80  Ht '6\' 1"'$  (1.854 m)   Wt 274 lb 9.6 oz (124.6 kg)   SpO2 98%   BMI 36.23 kg/m     Wt Readings from Last 3 Encounters:  01/22/22 274 lb 9.6 oz (124.6 kg)  12/28/21 275 lb 6.4 oz (124.9 kg)  11/30/21 270 lb (122.5 kg)     GEN:  Well nourished, well developed in no acute distress HEENT: Normal NECK: No JVD; No carotid bruits CARDIAC: RRR, no murmurs, rubs, gallops RESPIRATORY:  Clear to auscultation without rales, wheezing or rhonchi  ABDOMEN: Soft, non-tender, non-distended MUSCULOSKELETAL:  No edema; No deformity. 2+pedal pulses, equal bilaterally SKIN: Warm and dry NEUROLOGIC:  Alert and oriented x 3 PSYCHIATRIC:  Normal affect   EKG:  EKG is not ordered today.    Diagnoses:    1. Hyperlipidemia LDL goal <70   2. Persistent atrial fibrillation ([place])   3. Secondary hypercoagulable state ([place])   4. OSA (obstructive sleep apnea)   5. Coronary artery disease involving native coronary artery of native heart without angina pectoris   6. Essential hypertension   7. Status post ablation of atrial fibrillation    Assessment and Plan:     Atrial fibrillation on chronic anticoagulation: Feeling well since ablation 11/30/21 with no return of a fib to his awareness. Monitors with smart watch. HR is well-controlled. Is continuing with anticoagulation as advised, no bleeding concerns.  Has follow-up with Dr. Curt Bears in September. Advised him to discuss further advisement regarding anticoagulation at that time. Continue metoprolol, Eliquis.  CAD without angina: Mild non-obstructive CAD by coronary CTA 11/23/21. He denies chest pain, dyspnea, or other symptoms concerning for angina.  No indication for  further ischemic evaluation at this time. Encouraged regular physical activity to achieve 150 minutes of moderate exercise each week.  Encouraged heart healthy, mostly plant-based diet.  Continue amlodipine, rosuvastatin, metoprolol.  Hypertension: BP is elevated today initially and by my recheck.  He reports currently experiencing more life stress due to wife opening her own business.  Home BP typically 130/80. BP parameters and monitoring information given and advised him to notify us if BP consistently > 130/80.  Continue amlodipine, metoprolol.  Hyperlipidemia LDL goal < 70: LDL 84 on 12/11/21. Coronary calcium score 20 on 11/23/21. Had problems getting rosuvastatin 10 mg tablets so was splitting 20 mg tabs and they crumble. Will recheck lipids at next office visit.   Sleep apnea: Reports mild sleep apnea diagnosed on sleep study 10/2021. Is awaiting equipment/follow-up. I sent a message to our sleep team to follow-up with him.      Disposition: Keep your September appointment with Dr. Kennith Center months with Dr. Johney Frame  Medication Adjustments/Labs and Tests Ordered: Current medicines are reviewed at length with the patient today.  Concerns regarding medicines are outlined above.  No orders of the defined types were placed in this encounter.  No orders of the defined types were placed in this encounter.   Patient Instructions  Medication Instructions:   Your physician recommends that you continue on your current medications as directed. Please refer to the Current Medication list given to you today.   *If you need a refill on your cardiac medications before your next appointment, please call your pharmacy*   Lab Work:  None ordered.  If you have labs (blood work) drawn today and your tests are completely normal, you will receive your results only by: [place] (if you have MyChart) OR A paper copy in the mail If you have any  lab test that is abnormal or we need to change your  treatment, we will call you to review the results.   Testing/Procedures:  None ordered.   Follow-Up: At [facility], you and your health needs are our priority.  As part of our continuing mission to provide you with exceptional heart care, we have created designated Provider Care Teams.  These Care Teams include your primary Cardiologist (physician) and Advanced Practice Providers (APPs -  Physician Assistants and Nurse Practitioners) who all work together to provide you with the care you need, when you need it.  We recommend signing up for the patient portal called "MyChart".  Sign up information is provided on this After Visit Summary.  MyChart is used to connect with patients for Virtual Visits (Telemedicine).  Patients are able to view lab/test results, encounter notes, upcoming appointments, etc.  Non-urgent messages can be sent to your provider as well.   To learn more about what you can do with MyChart, go to [email or web address].    Your next appointment:   8 month(s)  The format for your next appointment:   In Person  Provider:   [name], MD     Other Instructions  Your physician wants you to follow-up in: 8 months with Dr. Johney Frame.  You will receive a reminder letter in the mail two months in advance. If you don't receive a letter, please call our office to schedule the follow-up appointment.  Adopting a Healthy Lifestyle.   Weight: Know what a healthy weight is for you (roughly BMI <25) and aim to maintain this. You can calculate your body mass index on your smart phone  Diet: Aim for 7+ servings of fruits and vegetables daily Limit animal fats in diet for cholesterol and heart health - choose grass fed whenever available Avoid highly processed foods (fast food burgers, tacos, fried chicken, pizza, hot dogs, french fries)  Saturated fat comes in the form of butter, lard, coconut oil, margarine, partially hydrogenated oils, and fat in meat. These  increase your risk of cardiovascular disease.  Use healthy plant oils, such as olive, canola, soy, corn, sunflower and peanut.  Whole foods such as fruits, vegetables and whole grains have fiber  Men need > 38 grams of fiber per day Women need > 25 grams of fiber per day  Load up on vegetables and fruits - one-half of your plate: Aim for color and variety, and remember that potatoes dont count. Go for whole grains - one-quarter of your plate: Whole wheat, barley, wheat berries, quinoa, oats, brown rice, and foods made with them. If you want pasta, go with whole wheat pasta. Protein power - one-quarter of your plate: Fish, chicken, beans, and nuts are all healthy, versatile protein sources. Limit red meat. You need carbohydrates for energy! The type of carbohydrate is more important than the amount. Choose carbohydrates such as vegetables, fruits, whole grains, beans, and nuts in the place of white rice, white pasta, potatoes (baked or fried), macaroni and cheese, cakes, cookies, and donuts.  If youre thirsty, drink water. Coffee and tea are good in moderation, but skip sugary drinks and limit milk and dairy products to one or two daily servings. Keep sugar intake at 6 teaspoons or 24 grams or LESS       Exercise: Aim for 150 min of moderate intensity exercise weekly for heart health, and weights twice weekly for bone health Stay active - any steps are better than no steps! Aim for 7-9 hours  of sleep daily  Mediterranean Diet A Mediterranean diet refers to food and lifestyle choices that are based on the traditions of countries located on the [employer]. It focuses on eating more fruits, vegetables, whole grains, beans, nuts, seeds, and heart-healthy fats, and eating less dairy, meat, eggs, and processed foods with added sugar, salt, and fat. This way of eating has been shown to help prevent certain conditions and improve outcomes for people who have chronic diseases, like kidney disease  and heart disease. What are tips for following this plan? Reading food labels Check the serving size of packaged foods. For foods such as rice and pasta, the serving size refers to the amount of cooked product, not dry. Check the total fat in packaged foods. Avoid foods that have saturated fat or trans fats. Check the ingredient list for added sugars, such as corn syrup. Shopping  Buy a variety of foods that offer a balanced diet, including: Fresh fruits and vegetables (produce). Grains, beans, nuts, and seeds. Some of these may be available in unpackaged forms or large amounts (in bulk). Fresh seafood. Poultry and eggs. Low-fat dairy products. Buy whole ingredients instead of prepackaged foods. Buy fresh fruits and vegetables in-season from local farmers markets. Buy plain frozen fruits and vegetables. If you do not have access to quality fresh seafood, buy precooked frozen shrimp or canned fish, such as tuna, salmon, or sardines. Stock your pantry so you always have certain foods on hand, such as olive oil, canned tuna, canned tomatoes, rice, pasta, and beans. Cooking Cook foods with extra-virgin olive oil instead of using butter or other vegetable oils. Have meat as a side dish, and have vegetables or grains as your main dish. This means having meat in small portions or adding small amounts of meat to foods like pasta or stew. Use beans or vegetables instead of meat in common dishes like chili or lasagna. Experiment with different cooking methods. Try roasting, broiling, steaming, and sauting vegetables. Add frozen vegetables to soups, stews, pasta, or rice. Add nuts or seeds for added healthy fats and plant protein at each meal. You can add these to yogurt, salads, or vegetable dishes. Marinate fish or vegetables using olive oil, lemon juice, garlic, and fresh herbs. Meal planning Plan to eat one vegetarian meal one day each week. Try to work up to two vegetarian meals, if  possible. Eat seafood two or more times a week. Have healthy snacks readily available, such as: Vegetable sticks with hummus. Greek yogurt. Fruit and nut trail mix. Eat balanced meals throughout the week. This includes: Fruit: 2-3 servings a day. Vegetables: 4-5 servings a day. Low-fat dairy: 2 servings a day. Fish, poultry, or lean meat: 1 serving a day. Beans and legumes: 2 or more servings a week. Nuts and seeds: 1-2 servings a day. Whole grains: 6-8 servings a day. Extra-virgin olive oil: 3-4 servings a day. Limit red meat and sweets to only a few servings a month. Lifestyle  Cook and eat meals together with your family, when possible. Drink enough fluid to keep your urine pale yellow. Be physically active every day. This includes: Aerobic exercise like running or swimming. Leisure activities like gardening, walking, or housework. Get 7-8 hours of sleep each night. If recommended by your health care provider, drink red wine in moderation. This means 1 glass a day for nonpregnant women and 2 glasses a day for men. A glass of wine equals 5 oz (150 mL). What foods should I eat? Fruits Apples. Apricots.  Avocado. Berries. Bananas. Cherries. Dates. Figs. Grapes. Lemons. Melon. Oranges. Peaches. Plums. Pomegranate. Vegetables Artichokes. Beets. Broccoli. Cabbage. Carrots. Eggplant. Green beans. Chard. Kale. Spinach. Onions. Leeks. Peas. Squash. Tomatoes. Peppers. Radishes. Grains Whole-grain pasta. Brown rice. Bulgur wheat. Polenta. Couscous. Whole-wheat bread. [place]. Meats and other proteins Beans. Almonds. Sunflower seeds. Pine nuts. Peanuts. [place]. Salmon. Scallops. Shrimp. [place]. Tilapia. Clams. Oysters. Eggs. Poultry without skin. Dairy Low-fat milk. Cheese. Greek yogurt. Fats and oils Extra-virgin olive oil. Avocado oil. Grapeseed oil. Beverages Water. Red wine. Herbal tea. Sweets and desserts Greek yogurt with honey. Baked apples. Poached pears. Trail  mix. Seasonings and condiments Basil. Cilantro. Coriander. Cumin. Mint. Parsley. Sage. Rosemary. Tarragon. Garlic. Oregano. Thyme. Pepper. Balsamic vinegar. Tahini. Hummus. Tomato sauce. Olives. Mushrooms. The items listed above may not be a complete list of foods and beverages you can eat. Contact a dietitian for more information. What foods should I limit? This is a list of foods that should be eaten rarely or only on special occasions. Fruits Fruit canned in syrup. Vegetables Deep-fried potatoes (french fries). Grains Prepackaged pasta or rice dishes. Prepackaged cereal with added sugar. Prepackaged snacks with added sugar. Meats and other proteins Beef. Pork. Lamb. Poultry with skin. Hot dogs. [place] Salines. Dairy Ice cream. Sour cream. Whole milk. Fats and oils Butter. Canola oil. Vegetable oil. Beef fat (tallow). Lard. Beverages Juice. Sugar-sweetened soft drinks. Beer. Liquor and spirits. Sweets and desserts Cookies. Cakes. Pies. Candy. Seasonings and condiments Mayonnaise. Pre-made sauces and marinades. The items listed above may not be a complete list of foods and beverages you should limit. Contact a dietitian for more information. Summary The Mediterranean diet includes both food and lifestyle choices. Eat a variety of fresh fruits and vegetables, beans, nuts, seeds, and whole grains. Limit the amount of red meat and sweets that you eat. If recommended by your health care provider, drink red wine in moderation. This means 1 glass a day for nonpregnant women and 2 glasses a day for men. A glass of wine equals 5 oz (150 mL). This information is not intended to replace advice given to you by your health care provider. Make sure you discuss any questions you have with your health care provider. Document Revised: 07/17/2019 Document Reviewed: 05/14/2019 Elsevier Patient Education  [place]         Signed, Emmaline Life, NP  01/22/2022 12:27 PM    [facility]

## 2022-01-22 ENCOUNTER — Encounter: Payer: Self-pay | Admitting: Nurse Practitioner

## 2022-01-22 ENCOUNTER — Ambulatory Visit: Payer: BC Managed Care – PPO | Admitting: Nurse Practitioner

## 2022-01-22 VITALS — BP 130/82 | HR 80 | Ht 73.0 in | Wt 274.6 lb

## 2022-01-22 DIAGNOSIS — D6869 Other thrombophilia: Secondary | ICD-10-CM

## 2022-01-22 DIAGNOSIS — G4733 Obstructive sleep apnea (adult) (pediatric): Secondary | ICD-10-CM | POA: Diagnosis not present

## 2022-01-22 DIAGNOSIS — Z9889 Other specified postprocedural states: Secondary | ICD-10-CM

## 2022-01-22 DIAGNOSIS — I4819 Other persistent atrial fibrillation: Secondary | ICD-10-CM | POA: Diagnosis not present

## 2022-01-22 DIAGNOSIS — E785 Hyperlipidemia, unspecified: Secondary | ICD-10-CM

## 2022-01-22 DIAGNOSIS — Z8679 Personal history of other diseases of the circulatory system: Secondary | ICD-10-CM

## 2022-01-22 DIAGNOSIS — I2510 Atherosclerotic heart disease of native coronary artery without angina pectoris: Secondary | ICD-10-CM

## 2022-01-22 DIAGNOSIS — I10 Essential (primary) hypertension: Secondary | ICD-10-CM

## 2022-01-22 NOTE — Patient Instructions (Signed)
Medication Instructions:   Your physician recommends that you continue on your current medications as directed. Please refer to the Current Medication list given to you today.   *If you need a refill on your cardiac medications before your next appointment, please call your pharmacy*   Lab Work:  None ordered.  If you have labs (blood work) drawn today and your tests are completely normal, you will receive your results only by: [place] (if you have MyChart) OR A paper copy in the mail If you have any lab test that is abnormal or we need to change your treatment, we will call you to review the results.   Testing/Procedures:  None ordered.   Follow-Up: At [facility], you and your health needs are our priority.  As part of our continuing mission to provide you with exceptional heart care, we have created designated Provider Care Teams.  These Care Teams include your primary Cardiologist (physician) and Advanced Practice Providers (APPs -  Physician Assistants and Nurse Practitioners) who all work together to provide you with the care you need, when you need it.  We recommend signing up for the patient portal called "MyChart".  Sign up information is provided on this After Visit Summary.  MyChart is used to connect with patients for Virtual Visits (Telemedicine).  Patients are able to view lab/test results, encounter notes, upcoming appointments, etc.  Non-urgent messages can be sent to your provider as well.   To learn more about what you can do with MyChart, go to [email or web address].    Your next appointment:   8 month(s)  The format for your next appointment:   In Person  Provider:   [name], MD     Other Instructions  Your physician wants you to follow-up in: 8 months with Dr. Johney Frame.  You will receive a reminder letter in the mail two months in advance. If you don't receive a letter, please call our office to schedule the follow-up  appointment.  Adopting a Healthy Lifestyle.   Weight: Know what a healthy weight is for you (roughly BMI <25) and aim to maintain this. You can calculate your body mass index on your smart phone  Diet: Aim for 7+ servings of fruits and vegetables daily Limit animal fats in diet for cholesterol and heart health - choose grass fed whenever available Avoid highly processed foods (fast food burgers, tacos, fried chicken, pizza, hot dogs, french fries)  Saturated fat comes in the form of butter, lard, coconut oil, margarine, partially hydrogenated oils, and fat in meat. These increase your risk of cardiovascular disease.  Use healthy plant oils, such as olive, canola, soy, corn, sunflower and peanut.  Whole foods such as fruits, vegetables and whole grains have fiber  Men need > 38 grams of fiber per day Women need > 25 grams of fiber per day  Load up on vegetables and fruits - one-half of your plate: Aim for color and variety, and remember that potatoes dont count. Go for whole grains - one-quarter of your plate: Whole wheat, barley, wheat berries, quinoa, oats, brown rice, and foods made with them. If you want pasta, go with whole wheat pasta. Protein power - one-quarter of your plate: Fish, chicken, beans, and nuts are all healthy, versatile protein sources. Limit red meat. You need carbohydrates for energy! The type of carbohydrate is more important than the amount. Choose carbohydrates such as vegetables, fruits, whole grains, beans, and nuts in the place of white rice, white pasta,  potatoes (baked or fried), macaroni and cheese, cakes, cookies, and donuts.  If youre thirsty, drink water. Coffee and tea are good in moderation, but skip sugary drinks and limit milk and dairy products to one or two daily servings. Keep sugar intake at 6 teaspoons or 24 grams or LESS       Exercise: Aim for 150 min of moderate intensity exercise weekly for heart health, and weights twice weekly for bone  health Stay active - any steps are better than no steps! Aim for 7-9 hours of sleep daily  Mediterranean Diet A Mediterranean diet refers to food and lifestyle choices that are based on the traditions of countries located on the [employer]. It focuses on eating more fruits, vegetables, whole grains, beans, nuts, seeds, and heart-healthy fats, and eating less dairy, meat, eggs, and processed foods with added sugar, salt, and fat. This way of eating has been shown to help prevent certain conditions and improve outcomes for people who have chronic diseases, like kidney disease and heart disease. What are tips for following this plan? Reading food labels Check the serving size of packaged foods. For foods such as rice and pasta, the serving size refers to the amount of cooked product, not dry. Check the total fat in packaged foods. Avoid foods that have saturated fat or trans fats. Check the ingredient list for added sugars, such as corn syrup. Shopping  Buy a variety of foods that offer a balanced diet, including: Fresh fruits and vegetables (produce). Grains, beans, nuts, and seeds. Some of these may be available in unpackaged forms or large amounts (in bulk). Fresh seafood. Poultry and eggs. Low-fat dairy products. Buy whole ingredients instead of prepackaged foods. Buy fresh fruits and vegetables in-season from local farmers markets. Buy plain frozen fruits and vegetables. If you do not have access to quality fresh seafood, buy precooked frozen shrimp or canned fish, such as tuna, salmon, or sardines. Stock your pantry so you always have certain foods on hand, such as olive oil, canned tuna, canned tomatoes, rice, pasta, and beans. Cooking Cook foods with extra-virgin olive oil instead of using butter or other vegetable oils. Have meat as a side dish, and have vegetables or grains as your main dish. This means having meat in small portions or adding small amounts of meat to foods  like pasta or stew. Use beans or vegetables instead of meat in common dishes like chili or lasagna. Experiment with different cooking methods. Try roasting, broiling, steaming, and sauting vegetables. Add frozen vegetables to soups, stews, pasta, or rice. Add nuts or seeds for added healthy fats and plant protein at each meal. You can add these to yogurt, salads, or vegetable dishes. Marinate fish or vegetables using olive oil, lemon juice, garlic, and fresh herbs. Meal planning Plan to eat one vegetarian meal one day each week. Try to work up to two vegetarian meals, if possible. Eat seafood two or more times a week. Have healthy snacks readily available, such as: Vegetable sticks with hummus. Greek yogurt. Fruit and nut trail mix. Eat balanced meals throughout the week. This includes: Fruit: 2-3 servings a day. Vegetables: 4-5 servings a day. Low-fat dairy: 2 servings a day. Fish, poultry, or lean meat: 1 serving a day. Beans and legumes: 2 or more servings a week. Nuts and seeds: 1-2 servings a day. Whole grains: 6-8 servings a day. Extra-virgin olive oil: 3-4 servings a day. Limit red meat and sweets to only a few servings a month. Lifestyle  Cook and eat meals together with your family, when possible. Drink enough fluid to keep your urine pale yellow. Be physically active every day. This includes: Aerobic exercise like running or swimming. Leisure activities like gardening, walking, or housework. Get 7-8 hours of sleep each night. If recommended by your health care provider, drink red wine in moderation. This means 1 glass a day for nonpregnant women and 2 glasses a day for men. A glass of wine equals 5 oz (150 mL). What foods should I eat? Fruits Apples. Apricots. Avocado. Berries. Bananas. Cherries. Dates. Figs. Grapes. Lemons. Melon. Oranges. Peaches. Plums. Pomegranate. Vegetables Artichokes. Beets. Broccoli. Cabbage. Carrots. Eggplant. Green beans. Chard. Kale. Spinach.  Onions. Leeks. Peas. Squash. Tomatoes. Peppers. Radishes. Grains Whole-grain pasta. Brown rice. Bulgur wheat. Polenta. Couscous. Whole-wheat bread. [place]. Meats and other proteins Beans. Almonds. Sunflower seeds. Pine nuts. Peanuts. [place]. Salmon. Scallops. Shrimp. [place]. Tilapia. Clams. Oysters. Eggs. Poultry without skin. Dairy Low-fat milk. Cheese. Greek yogurt. Fats and oils Extra-virgin olive oil. Avocado oil. Grapeseed oil. Beverages Water. Red wine. Herbal tea. Sweets and desserts Greek yogurt with honey. Baked apples. Poached pears. Trail mix. Seasonings and condiments Basil. Cilantro. Coriander. Cumin. Mint. Parsley. Sage. Rosemary. Tarragon. Garlic. Oregano. Thyme. Pepper. Balsamic vinegar. Tahini. Hummus. Tomato sauce. Olives. Mushrooms. The items listed above may not be a complete list of foods and beverages you can eat. Contact a dietitian for more information. What foods should I limit? This is a list of foods that should be eaten rarely or only on special occasions. Fruits Fruit canned in syrup. Vegetables Deep-fried potatoes (french fries). Grains Prepackaged pasta or rice dishes. Prepackaged cereal with added sugar. Prepackaged snacks with added sugar. Meats and other proteins Beef. Pork. Lamb. Poultry with skin. Hot dogs. Berniece Salines. Dairy Ice cream. Sour cream. Whole milk. Fats and oils Butter. Canola oil. Vegetable oil. Beef fat (tallow). Lard. Beverages Juice. Sugar-sweetened soft drinks. Beer. Liquor and spirits. Sweets and desserts Cookies. Cakes. Pies. Candy. Seasonings and condiments Mayonnaise. Pre-made sauces and marinades. The items listed above may not be a complete list of foods and beverages you should limit. Contact a dietitian for more information. Summary The Mediterranean diet includes both food and lifestyle choices. Eat a variety of fresh fruits and vegetables, beans, nuts, seeds, and whole grains. Limit the amount of red meat and sweets  that you eat. If recommended by your health care provider, drink red wine in moderation. This means 1 glass a day for nonpregnant women and 2 glasses a day for men. A glass of wine equals 5 oz (150 mL). This information is not intended to replace advice given to you by your health care provider. Make sure you discuss any questions you have with your health care provider. Document Revised: 07/17/2019 Document Reviewed: 05/14/2019 Elsevier Patient Education  [place]

## 2022-02-28 ENCOUNTER — Encounter: Payer: Self-pay | Admitting: Cardiology

## 2022-02-28 ENCOUNTER — Ambulatory Visit: Payer: BC Managed Care – PPO | Attending: Cardiology | Admitting: Cardiology

## 2022-02-28 VITALS — BP 138/82 | HR 76 | Ht 73.0 in | Wt 267.0 lb

## 2022-02-28 DIAGNOSIS — I4819 Other persistent atrial fibrillation: Secondary | ICD-10-CM | POA: Diagnosis not present

## 2022-02-28 DIAGNOSIS — I10 Essential (primary) hypertension: Secondary | ICD-10-CM | POA: Diagnosis not present

## 2022-02-28 MED ORDER — METOPROLOL SUCCINATE ER 50 MG PO TB24: 50.0000 mg | ORAL_TABLET | Freq: Every day | ORAL | 3 refills | Status: DC

## 2022-02-28 NOTE — Progress Notes (Signed)
Electrophysiology Office Note   Date:  02/28/2022   ID:  James Weeks, DOB 01-Jul-1960, MRN [id number]  PCP:  Earlyne Iba, NP  Cardiologist:  Johney Frame Primary Electrophysiologist:  Macklen Wilhoite Meredith Leeds, MD    Chief Complaint: AF   History of Present Illness: James Weeks is a 61 y.o. male who is being seen today for the evaluation of AF at the request of Potts, Georgeann Oppenheim, NP. Presenting today for electrophysiology evaluation.  He has a history significant hypertension, hyperlipidemia, elevated PSA, atrial fibrillation.  He was seen March 2021 and was found to have new onset atrial fibrillation.  He was started on Eliquis.  He had a cardioversion May 2021, but developed recurrent atrial fibrillation.  He is now status post ablation 11/30/2021.  Today, denies symptoms of palpitations, chest pain, shortness of breath, orthopnea, PND, lower extremity edema, claudication, dizziness, presyncope, syncope, bleeding, or neurologic sequela. The patient is tolerating medications without difficulties.  Since being seen he has done well.  He has noted no further episodes of atrial fibrillation.  Despite that, he has continued to feel fatigued.  He is able to do his daily activities, but feels that he has to do them more slowly and sleeps more.   Past Medical History:  Diagnosis Date   Allergic rhinitis due to pollen    Anticoagulant long-term use    eliquis-- managed by cardiology   Anxiety    Emphysema lung ([place])    per chest CT in epic 09-29-2020   Hyperlipidemia    Hypertension    Mild CAD    cardiologist--- [name]---  per coronary CT 09-05-2018 mild cad involving midLAD, moderate torturous coronary arteries, calcium score =1   PAF (paroxysmal atrial fibrillation) [facility])    cardiologist-- [name]-- dx 03/ 2021 asymptomatic   Prostate cancer [facility])    urologist--- dr gay--- dx 04/ 2020 Gleason 3+3 active survillence;  bx 12/ 2020 ,Gleason3+4 survillence;  MRI fusion bx  04/ 2022 Gleason 3+4   Pulmonary nodules    last chest CT in epic 09-29-2020 bilateral upper lobe nodules stable, benign   Past Surgical History:  Procedure Laterality Date   ATRIAL FIBRILLATION ABLATION N/A 11/30/2021   Procedure: ATRIAL FIBRILLATION ABLATION;  Surgeon: Constance Haw, MD;  Location: [place] [facility];  Service: Cardiovascular;  Laterality: N/A;   CARDIOVERSION N/A 10/30/2019   Procedure: CARDIOVERSION;  Surgeon: [name], MD;  Location: [place];  Service: Cardiovascular;  Laterality: N/A;   CARDIOVERSION N/A 09/26/2021   Procedure: CARDIOVERSION;  Surgeon: Josue Hector, MD;  Location: [facility] ENDOSCOPY;  Service: Cardiovascular;  Laterality: N/A;   COLONOSCOPY WITH PROPOFOL  2012   PROSTATE BIOPSY     RADIOACTIVE SEED IMPLANT N/A 02/06/2021   Procedure: RADIOACTIVE SEED IMPLANT/BRACHYTHERAPY IMPLANT WITH CYSTOSCOPY;  Surgeon: [name], MD;  Location: [facility];  Service: Urology;  Laterality: N/A;  74 seeds; No seeds detected in bladder per Dr. Abner Greenspan     Current Outpatient Medications  Medication Sig Dispense Refill   acetaminophen (TYLENOL) 325 MG tablet Take 650 mg by mouth every 6 (six) hours as needed for moderate pain.     amLODipine (NORVASC) 5 MG tablet TAKE 1 TABLET BY MOUTH EVERYDAY AT BEDTIME 90 tablet 2   apixaban (ELIQUIS) 5 MG TABS tablet Take 1 tablet (5 mg total) by mouth 2 (two) times daily. 180 tablet 1   diazepam (VALIUM) 10 MG tablet Take 5 mg by mouth daily  as needed for anxiety.      fluticasone (FLONASE) 50 MCG/ACT nasal spray Place 50 mcg into the nose daily as needed for allergies or rhinitis.     loratadine (CLARITIN) 10 MG tablet Take 10 mg by mouth daily as needed for allergies.     metoprolol succinate (TOPROL-XL) 50 MG 24 hr tablet Take 1 tablet (50 mg total) by mouth daily. Take with or immediately following a meal. 90 tablet 3   rosuvastatin (CRESTOR) 20 MG tablet Take 0.5 tablets (10 mg total) by mouth  daily. 45 tablet 1   tamsulosin (FLOMAX) 0.4 MG CAPS capsule Take 0.4 mg by mouth at bedtime.     No current facility-administered medications for this visit.    Allergies:   Patient has no known allergies.   Social History:  The patient  reports that he has been smoking cigars. He quit smokeless tobacco use about 38 years ago.  His smokeless tobacco use included chew. He reports current alcohol use. He reports that he does not use drugs.   Family History:  The patient's family history includes Asthma in his brother and father; Breast cancer in his maternal aunt and mother; CVA in an other family member; Cancer in his maternal grandmother and paternal grandmother; Diabetes in his mother; Heart attack in an other family member; Heart disease in his father and another family member; Hypertension in his mother and another family member; Lung cancer in his maternal grandfather; Other (age of onset: 75) in his father; Prostate cancer in his paternal grandfather and paternal uncle.   ROS:  Please see the history of present illness.   Otherwise, review of systems is positive for none.   All other systems are reviewed and negative.   PHYSICAL EXAM: VS:  BP 138/82   Pulse 76   Ht '6\' 1"'$  (1.854 m)   Wt 267 lb (121.1 kg)   SpO2 94%   BMI 35.23 kg/m  , BMI Body mass index is 35.23 kg/m. GEN: Well nourished, well developed, in no acute distress  HEENT: normal  Neck: no JVD, carotid bruits, or masses Cardiac: RRR; no murmurs, rubs, or gallops,no edema  Respiratory:  clear to auscultation bilaterally, normal work of breathing GI: soft, nontender, nondistended, + BS MS: no deformity or atrophy  Skin: warm and dry Neuro:  Strength and sensation are intact Psych: euthymic mood, full affect  EKG:  EKG is ordered today. Personal review of the ekg ordered shows sinus rhythm   Recent Labs: 11/10/2021: BUN 9; Creatinine, Ser 0.89; Hemoglobin 15.1; Platelets 243; Potassium 4.0; Sodium 139    Lipid  Panel     Component Value Date/Time   CHOL 198 01/05/2021 1030   TRIG 191 (H) 01/05/2021 1030   HDL 33 (L) 01/05/2021 1030   CHOLHDL 6.0 (H) 01/05/2021 1030   LDLCALC 131 (H) 01/05/2021 1030     Wt Readings from Last 3 Encounters:  02/28/22 267 lb (121.1 kg)  01/22/22 274 lb 9.6 oz (124.6 kg)  12/28/21 275 lb 6.4 oz (124.9 kg)      Other studies Reviewed: Additional studies/ records that were reviewed today include: TTE 09/23/19  Review of the above records today demonstrates:   1. Left ventricular ejection fraction, by estimation, is 50 to 55%. The  left ventricle has low normal function. The left ventricle has no regional  wall motion abnormalities. Left ventricular diastolic function could not  be evaluated.   2. Right ventricular systolic function is normal. The right ventricular  size is normal. The estimated right ventricular systolic pressure is 38.4  mmHg.   3. Left atrial size was mildly dilated.   4. The mitral valve is normal in structure. Trivial mitral valve  regurgitation. No evidence of mitral stenosis.   5. The aortic valve is normal in structure. Aortic valve regurgitation is  not visualized. Mild aortic valve sclerosis is present, with no evidence  of aortic valve stenosis.   6. The inferior vena cava is normal in size with greater than 50%  respiratory variability, suggesting right atrial pressure of 3 mmHg.    ASSESSMENT AND PLAN:  1.  Persistent atrial fibrillation: Status post ablation 11/30/2021.  Currently on Eliquis 5 mg twice daily, Toprol XL 100 mg daily.  CHA2DS2-VASc of 2.  Is remained in sinus rhythm.  He is overall quite happy with his control.  He does have some fatigue.  We Joncarlo Friberg reduce his Toprol-XL to 50 mg.  If he continues to have fatigue on this dose, we Onisha Cedeno consider stopping the medication.  2.  Hypertension: Currently well controlled  3.  Mild coronary artery disease: Continue Crestor per primary cardiology  4.  Secondary  hypercoagulable state: Currently on Eliquis for atrial fibrillation as above  5.  Morbid obesity: Lifestyle modification encouraged Body mass index is 35.23 kg/m.  6.  Obstructive sleep apnea: CPAP compliance encouraged  Current medicines are reviewed at length with the patient today.   The patient does not have concerns regarding his medicines.  The following changes were made today: Reduce Toprol-XL  Labs/ tests ordered today include:  Orders Placed This Encounter  Procedures   EKG 12-Lead     Disposition:   FU 6 months  Signed, Ivette Castronova Meredith Leeds, MD  02/28/2022 2:03 PM     [address] [place] [address] [phone] (office) [phone] (fax)

## 2022-02-28 NOTE — Patient Instructions (Signed)
Medication Instructions:  Your physician has recommended you make the following change in your medication:  DECREASE Toprol to 50 mg once daily  *If you need a refill on your cardiac medications before your next appointment, please call your pharmacy*   Lab Work: None ordered   Testing/Procedures: None ordered   Follow-Up: At [facility], you and your health needs are our priority.  As part of our continuing mission to provide you with exceptional heart care, we have created designated Provider Care Teams.  These Care Teams include your primary Cardiologist (physician) and Advanced Practice Providers (APPs -  Physician Assistants and Nurse Practitioners) who all work together to provide you with the care you need, when you need it.  Your next appointment:   6 month(s)  The format for your next appointment:   In Person  Provider:   You will follow up in the [facility] located at [facility]. Your provider will be: Roderic Palau, NP or Clint R. Fenton, PA-C    Thank you for choosing [facility]!!   Trinidad Curet, RN [phone]  Other Instructions   Important Information About Sugar

## 2022-03-06 DIAGNOSIS — R350 Frequency of micturition: Secondary | ICD-10-CM | POA: Diagnosis not present

## 2022-03-06 DIAGNOSIS — C61 Malignant neoplasm of prostate: Secondary | ICD-10-CM | POA: Diagnosis not present

## 2022-04-02 DIAGNOSIS — E785 Hyperlipidemia, unspecified: Secondary | ICD-10-CM | POA: Diagnosis not present

## 2022-04-02 DIAGNOSIS — E559 Vitamin D deficiency, unspecified: Secondary | ICD-10-CM | POA: Diagnosis not present

## 2022-04-02 DIAGNOSIS — R7303 Prediabetes: Secondary | ICD-10-CM | POA: Diagnosis not present

## 2022-04-02 DIAGNOSIS — Z79899 Other long term (current) drug therapy: Secondary | ICD-10-CM | POA: Diagnosis not present

## 2022-04-02 DIAGNOSIS — I10 Essential (primary) hypertension: Secondary | ICD-10-CM | POA: Diagnosis not present

## 2022-04-02 DIAGNOSIS — I4891 Unspecified atrial fibrillation: Secondary | ICD-10-CM | POA: Diagnosis not present

## 2022-04-10 ENCOUNTER — Other Ambulatory Visit: Payer: Self-pay | Admitting: Cardiology

## 2022-04-10 DIAGNOSIS — I4819 Other persistent atrial fibrillation: Secondary | ICD-10-CM

## 2022-04-10 NOTE — Telephone Encounter (Signed)
Eliquis '5mg'$  refill request received. Patient is 61 years old, weight-121.1kg, Crea-0.89 on 11/10/2021, Diagnosis-Afib, and last seen by Dr. Curt Bears on 02/28/2022. Dose is appropriate based on dosing criteria. Will send in refill to requested pharmacy.

## 2022-05-25 ENCOUNTER — Other Ambulatory Visit: Payer: Self-pay

## 2022-05-25 MED ORDER — ROSUVASTATIN CALCIUM 20 MG PO TABS: 10.0000 mg | ORAL_TABLET | Freq: Every day | ORAL | 2 refills | Status: AC

## 2022-07-03 DIAGNOSIS — D2371 Other benign neoplasm of skin of right lower limb, including hip: Secondary | ICD-10-CM | POA: Diagnosis not present

## 2022-07-03 DIAGNOSIS — L821 Other seborrheic keratosis: Secondary | ICD-10-CM | POA: Diagnosis not present

## 2022-07-03 DIAGNOSIS — D2372 Other benign neoplasm of skin of left lower limb, including hip: Secondary | ICD-10-CM | POA: Diagnosis not present

## 2022-07-03 DIAGNOSIS — D225 Melanocytic nevi of trunk: Secondary | ICD-10-CM | POA: Diagnosis not present

## 2022-08-29 ENCOUNTER — Telehealth (HOSPITAL_COMMUNITY): Payer: Self-pay | Admitting: *Deleted

## 2022-08-29 ENCOUNTER — Ambulatory Visit (HOSPITAL_COMMUNITY)
Admission: RE | Admit: 2022-08-29 | Discharge: 2022-08-29 | Disposition: A | Discharge status: Home / Self Care | Payer: BC Managed Care – PPO | Source: Ambulatory Visit | Attending: Physician Assistant | Admitting: Physician Assistant

## 2022-08-29 VITALS — BP 158/96 | HR 94 | Ht 73.0 in | Wt 274.0 lb

## 2022-08-29 DIAGNOSIS — F17290 Nicotine dependence, other tobacco product, uncomplicated: Secondary | ICD-10-CM | POA: Diagnosis not present

## 2022-08-29 DIAGNOSIS — J439 Emphysema, unspecified: Secondary | ICD-10-CM | POA: Insufficient documentation

## 2022-08-29 DIAGNOSIS — E669 Obesity, unspecified: Secondary | ICD-10-CM | POA: Diagnosis not present

## 2022-08-29 DIAGNOSIS — I2510 Atherosclerotic heart disease of native coronary artery without angina pectoris: Secondary | ICD-10-CM | POA: Insufficient documentation

## 2022-08-29 DIAGNOSIS — Z79899 Other long term (current) drug therapy: Secondary | ICD-10-CM | POA: Diagnosis not present

## 2022-08-29 DIAGNOSIS — E785 Hyperlipidemia, unspecified: Secondary | ICD-10-CM | POA: Insufficient documentation

## 2022-08-29 DIAGNOSIS — Z6836 Body mass index (BMI) 36.0-36.9, adult: Secondary | ICD-10-CM | POA: Diagnosis not present

## 2022-08-29 DIAGNOSIS — G4733 Obstructive sleep apnea (adult) (pediatric): Secondary | ICD-10-CM | POA: Diagnosis not present

## 2022-08-29 DIAGNOSIS — Z7901 Long term (current) use of anticoagulants: Secondary | ICD-10-CM | POA: Diagnosis not present

## 2022-08-29 DIAGNOSIS — I10 Essential (primary) hypertension: Secondary | ICD-10-CM | POA: Insufficient documentation

## 2022-08-29 DIAGNOSIS — I491 Atrial premature depolarization: Secondary | ICD-10-CM | POA: Diagnosis not present

## 2022-08-29 DIAGNOSIS — D6869 Other thrombophilia: Secondary | ICD-10-CM | POA: Diagnosis not present

## 2022-08-29 DIAGNOSIS — I4819 Other persistent atrial fibrillation: Secondary | ICD-10-CM | POA: Diagnosis not present

## 2022-08-29 MED ORDER — METOPROLOL SUCCINATE ER 50 MG PO TB24: 50.0000 mg | ORAL_TABLET | Freq: Two times a day (BID) | ORAL | 1 refills | Status: DC

## 2022-08-29 NOTE — Patient Instructions (Signed)
Continue metoprolol at '50mg'$  twice a day

## 2022-08-29 NOTE — Telephone Encounter (Signed)
Patient called in stating his heart rates were elevated in the 120s on 3/5 had been since Monday afternoon. Pt previously on metoprolol '50mg'$  BID will take an extra '50mg'$  of metoprolol 3/5. Can repeat tomorrow if still elevated. Pt in agreement.

## 2022-08-29 NOTE — Progress Notes (Signed)
Primary Care Physician: Earlyne Iba, NP Primary Cardiologist: [name] Primary Electrophysiologist: Dr Curt Bears Referring Physician: Dr Nani Ravens Aurich is a 62 y.o. male with a history of HTN, HLD, CAD, OSA, HLD, atrial fibrillation who presents for consultation in the [facility]. He was seen March 2021 and was found to be in new onset atrial fibrillation.  He was asymptomatic and was started on Eliquis.  He had a cardioversion May 2021 with return to sinus rhythm.  Unfortunately he developed recurrent atrial fibrillation. Patient is on Eliquis for a CHADS2VASC score of 2. He underwent afib ablation with Dr Curt Bears on 11/30/21.   On follow up today, patient was having elevated heart rates on 08/28/22 and took an extra 50 mg of metoprolol which resolved his symptoms in about 3 hours. He is in SR today. There were no specific triggers that he could identify.   Today, he denies symptoms of palpitations, chest pain, shortness of breath, orthopnea, PND, lower extremity edema, dizziness, presyncope, syncope, snoring, daytime somnolence, bleeding, or neurologic sequela. The patient is tolerating medications without difficulties and is otherwise without complaint today.    Atrial Fibrillation Risk Factors:  he does have symptoms or diagnosis of sleep apnea. he is waiting for CPAP titration.  he does not have a history of rheumatic fever.   he has a BMI of Body mass index is 36.15 kg/m.Marland Kitchen Filed Weights   08/29/22 1426  Weight: 124.3 kg    Family History  Problem Relation Age of Onset   Hypertension Mother    Diabetes Mother    Breast cancer Mother    Heart disease Father    Asthma Father    Other Father 30       enlarged prostate   Asthma Brother    Hypertension Other    Heart disease Other    Heart attack Other    CVA Other    Breast cancer Maternal Aunt    Prostate cancer Paternal Uncle    Cancer Maternal Grandmother        type  unknown   Lung cancer Maternal Grandfather    Cancer Paternal Grandmother        cancer in lymph nodes in arm   Prostate cancer Paternal Grandfather    Colon cancer Neg Hx    Pancreatic cancer Neg Hx      Atrial Fibrillation Management history:  Previous antiarrhythmic drugs: none Previous cardioversions: 2021, 09/26/21 Previous ablations: 11/30/21 CHADS2VASC score: 2 Anticoagulation history: Eliquis   Past Medical History:  Diagnosis Date   Allergic rhinitis due to pollen    Anticoagulant long-term use    eliquis-- managed by cardiology   Anxiety    Emphysema lung ([place])    per chest CT in epic 09-29-2020   Hyperlipidemia    Hypertension    Mild CAD    cardiologist--- [name]---  per coronary CT 09-05-2018 mild cad involving midLAD, moderate torturous coronary arteries, calcium score =1   PAF (paroxysmal atrial fibrillation) [facility])    cardiologist-- [name]-- dx 03/ 2021 asymptomatic   Prostate cancer [facility])    urologist--- dr gay--- dx 04/ 2020 Gleason 3+3 active survillence;  bx 12/ 2020 ,Gleason3+4 survillence;  MRI fusion bx 04/ 2022 Gleason 3+4   Pulmonary nodules    last chest CT in epic 09-29-2020 bilateral upper lobe nodules stable, benign   Past Surgical History:  Procedure Laterality Date   ATRIAL FIBRILLATION ABLATION N/A 11/30/2021  Procedure: ATRIAL FIBRILLATION ABLATION;  Surgeon: Constance Haw, MD;  Location: [place] [facility];  Service: Cardiovascular;  Laterality: N/A;   CARDIOVERSION N/A 10/30/2019   Procedure: CARDIOVERSION;  Surgeon: [name], MD;  Location: [place];  Service: Cardiovascular;  Laterality: N/A;   CARDIOVERSION N/A 09/26/2021   Procedure: CARDIOVERSION;  Surgeon: Josue Hector, MD;  Location: [facility] ENDOSCOPY;  Service: Cardiovascular;  Laterality: N/A;   COLONOSCOPY WITH PROPOFOL  2012   PROSTATE BIOPSY     RADIOACTIVE SEED IMPLANT N/A 02/06/2021   Procedure: RADIOACTIVE SEED IMPLANT/BRACHYTHERAPY IMPLANT WITH  CYSTOSCOPY;  Surgeon: [name], MD;  Location: [facility];  Service: Urology;  Laterality: N/A;  74 seeds; No seeds detected in bladder per Dr. Abner Greenspan    Current Outpatient Medications  Medication Sig Dispense Refill   acetaminophen (TYLENOL) 325 MG tablet Take 650 mg by mouth every 6 (six) hours as needed for moderate pain.     amLODipine (NORVASC) 5 MG tablet TAKE 1 TABLET BY MOUTH EVERYDAY AT BEDTIME 90 tablet 2   diazepam (VALIUM) 10 MG tablet Take 5 mg by mouth daily as needed for anxiety.      ELIQUIS 5 MG TABS tablet TAKE 1 TABLET BY MOUTH TWICE A DAY 180 tablet 1   fluticasone (FLONASE) 50 MCG/ACT nasal spray Place 50 mcg into the nose daily as needed for allergies or rhinitis.     loratadine (CLARITIN) 10 MG tablet Take 10 mg by mouth daily as needed for allergies.     metoprolol succinate (TOPROL-XL) 50 MG 24 hr tablet Take 1 tablet (50 mg total) by mouth daily. Take with or immediately following a meal. (Patient taking differently: Take 50 mg by mouth in the morning and at bedtime. Take with or immediately following a meal.) 90 tablet 3   rosuvastatin (CRESTOR) 20 MG tablet Take 0.5 tablets (10 mg total) by mouth daily. 45 tablet 2   No current facility-administered medications for this encounter.    No Known Allergies  Social History   Socioeconomic History   Marital status: Married    Spouse name: Not on file   Number of children: 2   Years of education: Not on file   Highest education level: Not on file  Occupational History   Not on file  Tobacco Use   Smoking status: Some Days    Types: Cigars   Smokeless tobacco: Former    Types: Chew    Quit date: 1985   Tobacco comments:    02-02-2021  pt quit cigarette smoking 2012, has continued occasional cigar smoking  Vaping Use   Vaping Use: Never used  Substance and Sexual Activity   Alcohol use: Yes    Comment: occasionally   Drug use: Never   Sexual activity: Yes  Other Topics Concern   Not  on file  Social History Narrative   Not on file   Social Determinants of Health   Financial Resource Strain: Low Risk  (04/27/2021)   Overall Financial Resource Strain (CARDIA)    Difficulty of Paying Living Expenses: Not hard at all  Food Insecurity: No Food Insecurity (04/27/2021)   Hunger Vital Sign    Worried About Running Out of Food in the Last Year: Never true    [place] in the Last Year: Never true  Transportation Needs: No Transportation Needs (04/27/2021)   PRAPARE - Hydrologist (Medical): No    Lack of Transportation (Non-Medical):  No  Physical Activity: Sufficiently Active (04/27/2021)   Exercise Vital Sign    Days of Exercise per Week: 5 days    Minutes of Exercise per Session: 60 min  Stress: No Stress Concern Present (04/27/2021)   [place]    Feeling of Stress : Only a little  Social Connections: Moderately Isolated (04/27/2021)   Social Connection and Isolation Panel [NHANES]    Frequency of Communication with Friends and Family: More than three times a week    Frequency of Social Gatherings with Friends and Family: Twice a week    Attends Religious Services: Never    Marine scientist or Organizations: No    Attends Archivist Meetings: Never    Marital Status: Married  Human resources officer Violence: Not At Risk (04/27/2021)   Humiliation, Afraid, Rape, and Kick questionnaire    Fear of Current or Ex-Partner: No    Emotionally Abused: No    Physically Abused: No    Sexually Abused: No     ROS- All systems are reviewed and negative except as per the HPI above.  Physical Exam: Vitals:   08/29/22 1426  BP: (!) 158/96  Pulse: 94  Weight: 124.3 kg  Height: '6\' 1"'$  (1.854 m)    GEN- The patient is a well appearing male, alert and oriented x 3 today.   HEENT-head normocephalic, atraumatic, sclera clear, conjunctiva pink, hearing intact, trachea  midline. Lungs- Clear to ausculation bilaterally, normal work of breathing Heart- Regular rate and rhythm, occasional ectopic beats, no murmurs, rubs or gallops  GI- soft, NT, ND, + BS Extremities- no clubbing, cyanosis, or edema MS- no significant deformity or atrophy Skin- no rash or lesion Psych- euthymic mood, full affect Neuro- strength and sensation are intact   Wt Readings from Last 3 Encounters:  08/29/22 124.3 kg  02/28/22 121.1 kg  01/22/22 124.6 kg    EKG today demonstrates  SR with frequent PACs Vent. rate 94 BPM PR interval 120 ms QRS duration 86 ms QT/QTcB 366/457 ms  Echo 09/23/19 demonstrated   1. Left ventricular ejection fraction, by estimation, is 50 to 55%. The  left ventricle has low normal function. The left ventricle has no regional  wall motion abnormalities. Left ventricular diastolic function could not  be evaluated.   2. Right ventricular systolic function is normal. The right ventricular  size is normal. The estimated right ventricular systolic pressure is 99991111  mmHg.   3. Left atrial size was mildly dilated.   4. The mitral valve is normal in structure. Trivial mitral valve  regurgitation. No evidence of mitral stenosis.   5. The aortic valve is normal in structure. Aortic valve regurgitation is  not visualized. Mild aortic valve sclerosis is present, with no evidence  of aortic valve stenosis.   6. The inferior vena cava is normal in size with greater than 50%  respiratory variability, suggesting right atrial pressure of 3 mmHg.   Epic records are reviewed at length today  CHA2DS2-VASc Score = 2  The patient's score is based upon: CHF History: 0 HTN History: 1 Diabetes History: 0 Stroke History: 0 Vascular Disease History: 1 Age Score: 0 Gender Score: 0       ASSESSMENT AND PLAN: 1. Persistent Atrial Fibrillation (ICD10:  I48.19) The patient's CHA2DS2-VASc score is 2, indicating a 2.2% annual risk of stroke.   S/p afib ablation  11/30/21 Patient in [place] today. We discussed rhythm control options today. Will  keep higher dose of metoprolol. Could consider Multaq, flecainide, or repeat ablation if he has more persistent afib.  Continue Eliquis 5 mg BID Continue Toprol 50 mg BID  2. Secondary Hypercoagulable State (ICD10:  D68.69) The patient is at significant risk for stroke/thromboembolism based upon his CHA2DS2-VASc Score of 2.  Continue Apixaban (Eliquis).   3. Obesity Body mass index is 36.15 kg/m. Lifestyle modification was discussed and encouraged including regular physical activity and weight reduction.  4. Obstructive sleep apnea Patient has not completed CPAP titration yet. He will call sleep medicine to schedule.   5. CAD CAC score 20 on CT 11/23/21 On statin  6. HTN Elevated today, has been better controlled at previous visits. Will reassess at follow up.    Follow up in the AF clinic in 3 months.    [facility] [address] [place], [place] [address] [phone] 08/29/2022 3:16 PM

## 2022-09-03 DIAGNOSIS — Z6835 Body mass index (BMI) 35.0-35.9, adult: Secondary | ICD-10-CM | POA: Diagnosis not present

## 2022-09-03 DIAGNOSIS — J329 Chronic sinusitis, unspecified: Secondary | ICD-10-CM | POA: Diagnosis not present

## 2022-09-04 DIAGNOSIS — H0012 Chalazion right lower eyelid: Secondary | ICD-10-CM | POA: Diagnosis not present

## 2022-09-04 DIAGNOSIS — H524 Presbyopia: Secondary | ICD-10-CM | POA: Diagnosis not present

## 2022-09-05 DIAGNOSIS — C61 Malignant neoplasm of prostate: Secondary | ICD-10-CM | POA: Diagnosis not present

## 2022-09-12 DIAGNOSIS — R350 Frequency of micturition: Secondary | ICD-10-CM | POA: Diagnosis not present

## 2022-09-12 DIAGNOSIS — C61 Malignant neoplasm of prostate: Secondary | ICD-10-CM | POA: Diagnosis not present

## 2022-09-12 DIAGNOSIS — E349 Endocrine disorder, unspecified: Secondary | ICD-10-CM | POA: Diagnosis not present

## 2022-09-20 DIAGNOSIS — E291 Testicular hypofunction: Secondary | ICD-10-CM | POA: Diagnosis not present

## 2022-10-02 DIAGNOSIS — N400 Enlarged prostate without lower urinary tract symptoms: Secondary | ICD-10-CM | POA: Diagnosis not present

## 2022-10-02 DIAGNOSIS — F419 Anxiety disorder, unspecified: Secondary | ICD-10-CM | POA: Diagnosis not present

## 2022-10-02 DIAGNOSIS — Z79899 Other long term (current) drug therapy: Secondary | ICD-10-CM | POA: Diagnosis not present

## 2022-10-02 DIAGNOSIS — R7303 Prediabetes: Secondary | ICD-10-CM | POA: Diagnosis not present

## 2022-10-02 DIAGNOSIS — I10 Essential (primary) hypertension: Secondary | ICD-10-CM | POA: Diagnosis not present

## 2022-10-02 DIAGNOSIS — E785 Hyperlipidemia, unspecified: Secondary | ICD-10-CM | POA: Diagnosis not present

## 2022-10-03 ENCOUNTER — Other Ambulatory Visit: Payer: Self-pay

## 2022-10-03 MED ORDER — AMLODIPINE BESYLATE 5 MG PO TABS: ORAL_TABLET | ORAL | 0 refills | Status: DC

## 2022-11-07 ENCOUNTER — Other Ambulatory Visit: Payer: Self-pay | Admitting: Cardiology

## 2022-11-07 DIAGNOSIS — I4819 Other persistent atrial fibrillation: Secondary | ICD-10-CM

## 2022-11-07 NOTE — Telephone Encounter (Signed)
Prescription refill request for Eliquis received. Indication: Afib   Last office visit:08/29/22 ([place])  Scr: 0.85 (04/02/22 via scanned labs from PCP)  Age: 62 Weight: 124.3kg  Appropriate dose. Refill sent.

## 2022-11-12 DIAGNOSIS — J329 Chronic sinusitis, unspecified: Secondary | ICD-10-CM | POA: Diagnosis not present

## 2022-11-12 DIAGNOSIS — Z6836 Body mass index (BMI) 36.0-36.9, adult: Secondary | ICD-10-CM | POA: Diagnosis not present

## 2022-11-20 DIAGNOSIS — C61 Malignant neoplasm of prostate: Secondary | ICD-10-CM | POA: Diagnosis not present

## 2022-12-05 ENCOUNTER — Ambulatory Visit (HOSPITAL_COMMUNITY): Payer: BC Managed Care – PPO | Admitting: Physician Assistant

## 2022-12-12 ENCOUNTER — Ambulatory Visit (HOSPITAL_COMMUNITY): Payer: BC Managed Care – PPO | Admitting: Physician Assistant

## 2022-12-14 ENCOUNTER — Ambulatory Visit (HOSPITAL_COMMUNITY)
Admission: RE | Admit: 2022-12-14 | Discharge: 2022-12-14 | Disposition: A | Discharge status: Home / Self Care | Payer: BC Managed Care – PPO | Source: Ambulatory Visit | Attending: Physician Assistant | Admitting: Physician Assistant

## 2022-12-14 ENCOUNTER — Encounter (HOSPITAL_COMMUNITY): Payer: Self-pay | Admitting: Physician Assistant

## 2022-12-14 VITALS — BP 126/86 | HR 76 | Ht 73.0 in | Wt 280.0 lb

## 2022-12-14 DIAGNOSIS — Z6836 Body mass index (BMI) 36.0-36.9, adult: Secondary | ICD-10-CM | POA: Diagnosis not present

## 2022-12-14 DIAGNOSIS — G4733 Obstructive sleep apnea (adult) (pediatric): Secondary | ICD-10-CM | POA: Insufficient documentation

## 2022-12-14 DIAGNOSIS — I10 Essential (primary) hypertension: Secondary | ICD-10-CM | POA: Insufficient documentation

## 2022-12-14 DIAGNOSIS — I2510 Atherosclerotic heart disease of native coronary artery without angina pectoris: Secondary | ICD-10-CM | POA: Insufficient documentation

## 2022-12-14 DIAGNOSIS — Z7901 Long term (current) use of anticoagulants: Secondary | ICD-10-CM | POA: Diagnosis not present

## 2022-12-14 DIAGNOSIS — I4819 Other persistent atrial fibrillation: Secondary | ICD-10-CM

## 2022-12-14 DIAGNOSIS — E669 Obesity, unspecified: Secondary | ICD-10-CM | POA: Diagnosis not present

## 2022-12-14 DIAGNOSIS — Z79899 Other long term (current) drug therapy: Secondary | ICD-10-CM | POA: Insufficient documentation

## 2022-12-14 DIAGNOSIS — E785 Hyperlipidemia, unspecified: Secondary | ICD-10-CM | POA: Diagnosis not present

## 2022-12-14 DIAGNOSIS — D6869 Other thrombophilia: Secondary | ICD-10-CM | POA: Diagnosis not present

## 2022-12-14 NOTE — Progress Notes (Signed)
Primary Care Physician: Jim Like, NP Primary Cardiologist: Dr Shari Prows Primary Electrophysiologist: Dr Elberta Fortis Referring Physician: Dr Vicente Males James Weeks is a 62 y.o. male with a history of HTN, HLD, CAD, OSA, HLD, atrial fibrillation who presents for consultation in the [facility]. He was seen March 2021 and was found to be in new onset atrial fibrillation.  He was asymptomatic and was started on Eliquis.  He had a cardioversion May 2021 with return to sinus rhythm.  Unfortunately he developed recurrent atrial fibrillation. Patient is on Eliquis for a CHADS2VASC score of 2. He underwent afib ablation with Dr Elberta Fortis on 11/30/21.   On follow up today, patient reports that he has noticed an elevated heart rate several times per week which only lasts 1-2 minutes. There are no specific triggers and he is not symptomatic during these episodes. He tracks his HR on his smart watch. No bleeding issues on anticoagulation.   Today, he denies symptoms of palpitations, chest pain, shortness of breath, orthopnea, PND, lower extremity edema, dizziness, presyncope, syncope, bleeding, or neurologic sequela. The patient is tolerating medications without difficulties and is otherwise without complaint today.    Atrial Fibrillation Risk Factors:  he does have symptoms or diagnosis of sleep apnea. he is not on CPAP. he does not have a history of rheumatic fever.   Atrial Fibrillation Management history:  Previous antiarrhythmic drugs: none Previous cardioversions: 2021, 09/26/21 Previous ablations: 11/30/21 Anticoagulation history: Eliquis   Past Medical History:  Diagnosis Date   Allergic rhinitis due to pollen    Anticoagulant long-term use    eliquis-- managed by cardiology   Anxiety    Emphysema lung ([facility])    per chest CT in epic 09-29-2020   Hyperlipidemia    Hypertension    Mild CAD    cardiologist--- dr Shari Prows---  per coronary CT 09-05-2018  mild cad involving midLAD, moderate torturous coronary arteries, calcium score =1   PAF (paroxysmal atrial fibrillation) [facility])    cardiologist-- dr Shari Prows-- dx 03/ 2021 asymptomatic   Prostate cancer [facility])    urologist--- dr gay--- dx 04/ 2020 Gleason 3+3 active survillence;  bx 12/ 2020 ,Gleason3+4 survillence;  MRI fusion bx 04/ 2022 Gleason 3+4   Pulmonary nodules    last chest CT in epic 09-29-2020 bilateral upper lobe nodules stable, benign    ROS- All systems are reviewed and negative except as per the HPI above.  Physical Exam: Vitals:   12/14/22 1004  BP: 126/86  Pulse: 76  Weight: 127 kg  Height: 6\' 1"  (1.854 m)     GEN: Well nourished, well developed in no acute distress NECK: No JVD; No carotid bruits CARDIAC: Regular rate and rhythm, no murmurs, rubs, gallops RESPIRATORY:  Clear to auscultation without rales, wheezing or rhonchi  ABDOMEN: Soft, non-tender, non-distended EXTREMITIES:  No edema; No deformity    Wt Readings from Last 3 Encounters:  12/14/22 127 kg  08/29/22 124.3 kg  02/28/22 121.1 kg    EKG today demonstrates  SR with short PR vs ectopic rhythm, similar to previous ECGs Vent. rate 76 BPM PR interval 106 ms QRS duration 88 ms QT/QTcB 398/447 ms  Echo 09/23/19 demonstrated   1. Left ventricular ejection fraction, by estimation, is 50 to 55%. The  left ventricle has low normal function. The left ventricle has no regional  wall motion abnormalities. Left ventricular diastolic function could not  be evaluated.   2. Right ventricular systolic function is  normal. The right ventricular  size is normal. The estimated right ventricular systolic pressure is 26.4  mmHg.   3. Left atrial size was mildly dilated.   4. The mitral valve is normal in structure. Trivial mitral valve  regurgitation. No evidence of mitral stenosis.   5. The aortic valve is normal in structure. Aortic valve regurgitation is  not visualized. Mild aortic valve sclerosis is  present, with no evidence  of aortic valve stenosis.   6. The inferior vena cava is normal in size with greater than 50%  respiratory variability, suggesting right atrial pressure of 3 mmHg.   Epic records are reviewed at length today  CHA2DS2-VASc Score = 2  The patient's score is based upon: CHF History: 0 HTN History: 1 Diabetes History: 0 Stroke History: 0 Vascular Disease History: 1 Age Score: 0 Gender Score: 0       ASSESSMENT AND PLAN: Persistent Atrial Fibrillation (ICD10:  I48.19) The patient's CHA2DS2-VASc score is 2, indicating a 2.2% annual risk of stroke.   S/p afib ablation 11/30/21 We discussed having him wear a cardiac monitor to assess his palpitations. He would prefer to monitor with his smart watch and try to capture rhythm strips for review.  Continue Eliquis 5 mg BID Continue Toprol 50 mg BID  Secondary Hypercoagulable State (ICD10:  D68.69) The patient is at significant risk for stroke/thromboembolism based upon his CHA2DS2-VASc Score of 2.  Continue Apixaban (Eliquis).   Obesity Body mass index is 36.94 kg/m.  Encouraged lifestyle modification  OSA  Patient has not completed CPAP titration and does not want to consider CPAP at this time. He would like to discuss alternate treatment options. Will refer him to Dr Mayford Knife who read his sleep study.   CAD CAC score 20 on CT 11/23/21 On statin No anginal symptoms  HTN Stable on current regimen   Follow up with Dr Shari Prows as scheduled. AF clinic in 6 months.    James Loa PA-C Afib Clinic [facility] [address] [place], [place] [address] [phone] 12/14/2022 12:25 PM

## 2022-12-28 DIAGNOSIS — E291 Testicular hypofunction: Secondary | ICD-10-CM | POA: Diagnosis not present

## 2023-01-23 DIAGNOSIS — M545 Low back pain, unspecified: Secondary | ICD-10-CM | POA: Diagnosis not present

## 2023-01-23 DIAGNOSIS — M9904 Segmental and somatic dysfunction of sacral region: Secondary | ICD-10-CM | POA: Diagnosis not present

## 2023-01-23 DIAGNOSIS — M25512 Pain in left shoulder: Secondary | ICD-10-CM | POA: Diagnosis not present

## 2023-01-23 DIAGNOSIS — M9903 Segmental and somatic dysfunction of lumbar region: Secondary | ICD-10-CM | POA: Diagnosis not present

## 2023-01-23 DIAGNOSIS — M9905 Segmental and somatic dysfunction of pelvic region: Secondary | ICD-10-CM | POA: Diagnosis not present

## 2023-01-23 DIAGNOSIS — M25612 Stiffness of left shoulder, not elsewhere classified: Secondary | ICD-10-CM | POA: Diagnosis not present

## 2023-01-23 DIAGNOSIS — M7918 Myalgia, other site: Secondary | ICD-10-CM | POA: Diagnosis not present

## 2023-01-23 DIAGNOSIS — M9907 Segmental and somatic dysfunction of upper extremity: Secondary | ICD-10-CM | POA: Diagnosis not present

## 2023-01-24 DIAGNOSIS — J329 Chronic sinusitis, unspecified: Secondary | ICD-10-CM | POA: Diagnosis not present

## 2023-01-24 DIAGNOSIS — H6093 Unspecified otitis externa, bilateral: Secondary | ICD-10-CM | POA: Diagnosis not present

## 2023-01-24 DIAGNOSIS — T753XXA Motion sickness, initial encounter: Secondary | ICD-10-CM | POA: Diagnosis not present

## 2023-01-28 ENCOUNTER — Other Ambulatory Visit: Payer: Self-pay

## 2023-01-28 MED ORDER — AMLODIPINE BESYLATE 5 MG PO TABS: ORAL_TABLET | ORAL | 0 refills | Status: DC

## 2023-01-30 DIAGNOSIS — M9903 Segmental and somatic dysfunction of lumbar region: Secondary | ICD-10-CM | POA: Diagnosis not present

## 2023-01-30 DIAGNOSIS — M9905 Segmental and somatic dysfunction of pelvic region: Secondary | ICD-10-CM | POA: Diagnosis not present

## 2023-01-30 DIAGNOSIS — M7918 Myalgia, other site: Secondary | ICD-10-CM | POA: Diagnosis not present

## 2023-01-30 DIAGNOSIS — M9904 Segmental and somatic dysfunction of sacral region: Secondary | ICD-10-CM | POA: Diagnosis not present

## 2023-01-30 DIAGNOSIS — M9907 Segmental and somatic dysfunction of upper extremity: Secondary | ICD-10-CM | POA: Diagnosis not present

## 2023-01-30 DIAGNOSIS — M25512 Pain in left shoulder: Secondary | ICD-10-CM | POA: Diagnosis not present

## 2023-01-30 DIAGNOSIS — M25612 Stiffness of left shoulder, not elsewhere classified: Secondary | ICD-10-CM | POA: Diagnosis not present

## 2023-02-28 ENCOUNTER — Telehealth: Payer: Self-pay

## 2023-02-28 ENCOUNTER — Ambulatory Visit: Payer: BC Managed Care – PPO | Admitting: Cardiology

## 2023-02-28 MED ORDER — METOPROLOL SUCCINATE ER 50 MG PO TB24: 50.0000 mg | ORAL_TABLET | Freq: Every day | ORAL | Status: DC

## 2023-02-28 NOTE — Telephone Encounter (Signed)
Call to patient as he had an appointment today but never completed cpap titration. Patient states he thought he would be seen today for gen cards and sleep medicine. Patient states his only cardiac need is to clarify whether or not he can decrease his metoprolol dose, advised patient to refer back to C. [place], [place] as he was just seen by afib clinic in June. Message forwarded to C. Fenton.   Patient was advised to complete cpap titration, but states that because he had no plans to use cpap he did not complete titration. He states he thought there were other treatments besides cpap. Advised that treatments like Inspire Device are often only covered by insurance once a patient has failed on cpap. Also advised that cpap titration gives a lot of information about what level of support is needed in terms of O2, cpap settings, also gives information about severity of apnea and anatomical needs. Patient states he was not aware of this before and is willing to proceed with cpap titration. Forwarded to sleep coordinator to see if he can be pre-auth'd for another cpap titration or if he would have to have another sleep study.

## 2023-02-28 NOTE — Telephone Encounter (Signed)
Patient states last year with Dr Elberta Fortis he attempted decreasing metoprolol to 50mg  daily - he did this but had quick return of afib so he was instructed to return to 50mg  BID. Pt states his afib has been quiet over the last several months and would like to try and decrease metoprolol to 50mg  daily. Per Jorja Loa PA ok to reduce metoprolol to 50mg  daily if increase in palpitations or afib returns call office and will increase metoprolol back to BID. Pt in agreement.

## 2023-02-28 NOTE — Progress Notes (Deleted)
Sleep Medicine CONSULT Note    Date:  02/28/2023   ID:  James Weeks, DOB May 19, 1961, MRN [id number]  PCP:  Jim Like, NP  Cardiologist: Meriam Sprague, MD (Inactive)   No chief complaint on file.   History of Present Illness:  James Weeks is a 62 y.o. male who is being seen today for the evaluation of obstructive sleep apnea at the request of Laurance Flatten, MD.  This is a 62 year old male with a history of hyperlipidemia, hypertension, CAD, paroxysmal atrial fibrillation followed by EP.  He was seen by Dr. Elberta Fortis back in April 2023 and complained of snoring.  Given his atrial fibrillation a study was ordered.  NPSG done May 2023 demonstrated mild obstructive sleep apnea with an AHI of 10.7/h with nocturnal hypoxemia.  He was placed on auto CPAP from 4 to 15 cm H2O.  He is now referred for sleep medicine consultation to establish sleep care and treatment.  he is doing well with his PAP device and thinks that he has gotten used to it.  He tolerates the mask and feels the pressure is adequate.  Since going on PAP he feels rested in the am and has no significant daytime sleepiness.  He denies any significant mouth or nasal dryness or nasal congestion.  He does not think that he snores.    Past Medical History:  Diagnosis Date   Allergic rhinitis due to pollen    Anticoagulant long-term use    eliquis-- managed by cardiology   Anxiety    Emphysema lung (HCC)    per chest CT in epic 09-29-2020   Hyperlipidemia    Hypertension    Mild CAD    cardiologist--- dr Shari Prows---  per coronary CT 09-05-2018 mild cad involving midLAD, moderate torturous coronary arteries, calcium score =1   PAF (paroxysmal atrial fibrillation) [facility])    cardiologist-- dr Shari Prows-- dx 03/ 2021 asymptomatic   Prostate cancer [facility])    urologist--- dr gay--- dx 04/ 2020 Gleason 3+3 active survillence;  bx 12/ 2020 ,Gleason3+4 survillence;  MRI fusion bx 04/ 2022 Gleason 3+4    Pulmonary nodules    last chest CT in epic 09-29-2020 bilateral upper lobe nodules stable, benign    Past Surgical History:  Procedure Laterality Date   ATRIAL FIBRILLATION ABLATION N/A 11/30/2021   Procedure: ATRIAL FIBRILLATION ABLATION;  Surgeon: Regan Lemming, MD;  Location: [facility] INVASIVE CV LAB;  Service: Cardiovascular;  Laterality: N/A;   CARDIOVERSION N/A 10/30/2019   Procedure: CARDIOVERSION;  Surgeon: Pricilla Riffle, MD;  Location: [facility] ENDOSCOPY;  Service: Cardiovascular;  Laterality: N/A;   CARDIOVERSION N/A 09/26/2021   Procedure: CARDIOVERSION;  Surgeon: Wendall Stade, MD;  Location: [facility] ENDOSCOPY;  Service: Cardiovascular;  Laterality: N/A;   COLONOSCOPY WITH PROPOFOL  2012   PROSTATE BIOPSY     RADIOACTIVE SEED IMPLANT N/A 02/06/2021   Procedure: RADIOACTIVE SEED IMPLANT/BRACHYTHERAPY IMPLANT WITH CYSTOSCOPY;  Surgeon: Jannifer Hick, MD;  Location: [facility];  Service: Urology;  Laterality: N/A;  74 seeds; No seeds detected in bladder per Dr. Cardell Peach    Current Medications: No outpatient medications have been marked as taking for the 02/28/23 encounter (Appointment) with Quintella Reichert, MD.    Allergies:   Patient has no known allergies.   Social History   Socioeconomic History   Marital status: Married    Spouse name: Not on file   Number of children: 2   Years of education: Not  on file   Highest education level: Not on file  Occupational History   Not on file  Tobacco Use   Smoking status: Some Days    Types: Cigars   Smokeless tobacco: Former    Types: Chew    Quit date: 1985   Tobacco comments:    02-02-2021  pt quit cigarette smoking 2012, has continued occasional cigar smoking  Vaping Use   Vaping status: Never Used  Substance and Sexual Activity   Alcohol use: Yes    Comment: occasionally   Drug use: Never   Sexual activity: Yes  Other Topics Concern   Not on file  Social History Narrative   Not on file   Social Determinants of  Health   Financial Resource Strain: Low Risk  (04/27/2021)   Overall Financial Resource Strain (CARDIA)    Difficulty of Paying Living Expenses: Not hard at all  Food Insecurity: No Food Insecurity (04/27/2021)   Hunger Vital Sign    Worried About Running Out of Food in the Last Year: Never true    Ran Out of Food in the Last Year: Never true  Transportation Needs: No Transportation Needs (04/27/2021)   PRAPARE - Administrator, Civil Service (Medical): No    Lack of Transportation (Non-Medical): No  Physical Activity: Sufficiently Active (04/27/2021)   Exercise Vital Sign    Days of Exercise per Week: 5 days    Minutes of Exercise per Session: 60 min  Stress: No Stress Concern Present (04/27/2021)   [employer] of Occupational Health - Occupational Stress Questionnaire    Feeling of Stress : Only a little  Social Connections: Moderately Isolated (04/27/2021)   Social Connection and Isolation Panel [NHANES]    Frequency of Communication with Friends and Family: More than three times a week    Frequency of Social Gatherings with Friends and Family: Twice a week    Attends Religious Services: Never    Database administrator or Organizations: No    Attends Engineer, structural: Never    Marital Status: Married     Family History:  The patient's family history includes Asthma in his brother and father; Breast cancer in his maternal aunt and mother; CVA in an other family member; Cancer in his maternal grandmother and paternal grandmother; Diabetes in his mother; Heart attack in an other family member; Heart disease in his father and another family member; Hypertension in his mother and another family member; Lung cancer in his maternal grandfather; Other (age of onset: 71) in his father; Prostate cancer in his paternal grandfather and paternal uncle.   ROS:   Please see the history of present illness.    ROS All other systems reviewed and are negative.      No  data to display             PHYSICAL EXAM:   VS:  There were no vitals taken for this visit.   GEN: Well nourished, well developed, in no acute distress  HEENT: normal  Neck: no JVD, carotid bruits, or masses Cardiac: RRR; no murmurs, rubs, or gallops,no edema.  Intact distal pulses bilaterally.  Respiratory:  clear to auscultation bilaterally, normal work of breathing GI: soft, nontender, nondistended, + BS MS: no deformity or atrophy  Skin: warm and dry, no rash Neuro:  Alert and Oriented x 3, Strength and sensation are intact Psych: euthymic mood, full affect  Wt Readings from Last 3 Encounters:  12/14/22 280 lb (127  kg)  08/29/22 274 lb (124.3 kg)  02/28/22 267 lb (121.1 kg)      Studies/Labs Reviewed:   NPSG, PAP compliance download  Recent Labs: No results found for requested labs within last 365 days.      ASSESSMENT:    1. OSA (obstructive sleep apnea)   2. Essential hypertension      PLAN:  In order of problems listed above:  OSA - The patient is tolerating PAP therapy well without any problems. The PAP download performed by his DME was personally reviewed and interpreted by me today and showed an AHI of ***/hr on *** cm H2O with ***% compliance in using more than 4 hours nightly.  The patient has been using and benefiting from PAP use and will continue to benefit from therapy.   Hypertension -BP controlled on exam today -Continue prescription drug management with Amlodipine 5 mg daily and Toprol XL 50 mg twice daily with as needed refills  Time Spent: 20 minutes total time of encounter, including 15 minutes spent in face-to-face patient care on the date of this encounter. This time includes coordination of care and counseling regarding above mentioned problem list. Remainder of non-face-to-face time involved reviewing chart documents/testing relevant to the patient encounter and documentation in the medical record. I have independently reviewed  documentation from referring provider  Medication Adjustments/Labs and Tests Ordered: Current medicines are reviewed at length with the patient today.  Concerns regarding medicines are outlined above.  Medication changes, Labs and Tests ordered today are listed in the Patient Instructions below.  There are no Patient Instructions on file for this visit.   Signed, Armanda Magic, MD  02/28/2023 11:05 AM    [facility] [address] [place], [place], [place]  [address] Phone: [phone]; Fax: [phone]

## 2023-02-28 NOTE — Addendum Note (Signed)
Addended by: Shona Simpson on: 02/28/2023 03:36 PM   Modules accepted: Orders

## 2023-03-05 DIAGNOSIS — C61 Malignant neoplasm of prostate: Secondary | ICD-10-CM | POA: Diagnosis not present

## 2023-03-13 DIAGNOSIS — E349 Endocrine disorder, unspecified: Secondary | ICD-10-CM | POA: Diagnosis not present

## 2023-03-13 DIAGNOSIS — R350 Frequency of micturition: Secondary | ICD-10-CM | POA: Diagnosis not present

## 2023-03-13 DIAGNOSIS — C61 Malignant neoplasm of prostate: Secondary | ICD-10-CM | POA: Diagnosis not present

## 2023-03-26 ENCOUNTER — Ambulatory Visit: Payer: BC Managed Care – PPO | Admitting: Cardiology

## 2023-03-27 ENCOUNTER — Encounter: Payer: Self-pay | Admitting: Physician Assistant

## 2023-03-27 ENCOUNTER — Ambulatory Visit: Payer: BC Managed Care – PPO | Attending: Cardiology | Admitting: Physician Assistant

## 2023-03-27 VITALS — BP 134/78 | HR 75 | Ht 73.0 in | Wt 265.0 lb

## 2023-03-27 DIAGNOSIS — E7800 Pure hypercholesterolemia, unspecified: Secondary | ICD-10-CM | POA: Insufficient documentation

## 2023-03-27 DIAGNOSIS — I2510 Atherosclerotic heart disease of native coronary artery without angina pectoris: Secondary | ICD-10-CM | POA: Insufficient documentation

## 2023-03-27 DIAGNOSIS — I10 Essential (primary) hypertension: Secondary | ICD-10-CM | POA: Diagnosis not present

## 2023-03-27 DIAGNOSIS — I4819 Other persistent atrial fibrillation: Secondary | ICD-10-CM | POA: Diagnosis not present

## 2023-03-27 DIAGNOSIS — G4733 Obstructive sleep apnea (adult) (pediatric): Secondary | ICD-10-CM

## 2023-03-27 NOTE — Patient Instructions (Signed)
Medication Instructions:  Your physician recommends that you continue on your current medications as directed. Please refer to the Current Medication list given to you today.  *If you need a refill on your cardiac medications before your next appointment, please call your pharmacy*   Lab Work: None ordered  If you have labs (blood work) drawn today and your tests are completely normal, you will receive your results only by: [place] (if you have MyChart) OR A paper copy in the mail If you have any lab test that is abnormal or we need to change your treatment, we will call you to review the results.   Testing/Procedures: None ordered   Follow-Up: At [facility], you and your health needs are our priority.  As part of our continuing mission to provide you with exceptional heart care, we have created designated Provider Care Teams.  These Care Teams include your primary Cardiologist (physician) and Advanced Practice Providers (APPs -  Physician Assistants and Nurse Practitioners) who all work together to provide you with the care you need, when you need it.  We recommend signing up for the patient portal called "MyChart".  Sign up information is provided on this After Visit Summary.  MyChart is used to connect with patients for Virtual Visits (Telemedicine).  Patients are able to view lab/test results, encounter notes, upcoming appointments, etc.  Non-urgent messages can be sent to your provider as well.   To learn more about what you can do with MyChart, go to [email or web address].    Your next appointment:   6 month(s)  Provider:   Werner Lean, MD     Other Instructions

## 2023-03-27 NOTE — Progress Notes (Addendum)
Cardiology Office Note:    Date:  07/26/2023  ID:  [name], DOB 05-Oct-1960, MRN [id number] PCP: Jim Like, NP  [facility] Providers Cardiologist:  Christell Constant, MD Cardiology APP:  Beatrice Lecher, PA-C  Electrophysiologist:  Regan Lemming, MD       Patient Profile:      Persistent atrial fibrillation TTE 09/23/2019: EF 50-55, no RWMA, normal RVSF, RVSP 26.4, mild LAE, trivial MR, mild AV sclerosis, RAP 3 S/p failed cardioversion S/p PVI ablation 11/2021 Coronary artery disease  CCTA 09/05/2018: CAC score 1 (35th percentile); LAD mid 25-49; ascending aorta 36 mm Pre-A-fib ablation CCTA 11/23/2021: CAC score 20 (43rd percentile) Hypertension Lung nodule Emphysema Hyperlipidemia Prostate CA Sleep apnea         History of Present Illness:  Discussed the use of AI scribe software for clinical note transcription with the patient, who gave verbal consent to proceed.  James Weeks is a 62 y.o. male who returns for follow-up of CAD, A-fib.  Last seen in clinic by Eligha Bridegroom, NP 12/2021.  He is also followed by Dr. Elberta Fortis as well as the A-fib clinic for his atrial fibrillation.  He was last seen in A-fib clinic 12/14/2022 with symptoms of palpitations.  He was maintaining sinus rhythm.  Cardiac monitoring was discussed with the patient preferred to monitor his rhythm with his smart watch.  He was referred to sleep medicine for alternative management of sleep apnea.  He here alone. He has not had any AFib alerts on his watch. He reports no chest symptoms or shortness of breath, and his leg swelling has improved since reducing his metoprolol dosage.      Review of Systems  Gastrointestinal:  Negative for hematochezia and melena.  Genitourinary:  Negative for hematuria.  See HPI     Studies Reviewed:        Risk Assessment/Calculations:    CHA2DS2-VASc Score = 2   This indicates a 2.2% annual risk of stroke. The patient's score is based  upon: CHF History: 0 HTN History: 1 Diabetes History: 0 Stroke History: 0 Vascular Disease History: 1 Age Score: 0 Gender Score: 0         Physical Exam:   VS:  BP 134/78   Pulse 75   Ht 6\' 1"  (1.854 m)   Wt 265 lb (120.2 kg)   SpO2 98%   BMI 34.96 kg/m    Wt Readings from Last 3 Encounters:  07/17/23 270 lb (122.5 kg)  03/27/23 265 lb (120.2 kg)  12/14/22 280 lb (127 kg)    Constitutional:      Appearance: Healthy appearance. Not in distress.  Neck:     Vascular: JVD normal.  Pulmonary:     Breath sounds: Normal breath sounds. No wheezing. No rales.  Cardiovascular:     Normal rate. Regular rhythm.     Murmurs: There is no murmur.  Edema:    Peripheral edema absent.  Abdominal:     Palpations: Abdomen is soft.        Assessment and Plan:   Assessment & Plan Coronary artery disease involving native coronary artery of native heart without angina pectoris Minimal plaque on coronary CTA in 2020 and somewhat elevated calcium score in June 2023. No chest symptoms to suggest angina.  He is not on antiplatelet therapy as he is on Eliquis. -Continue Crestor 10mg  daily. Persistent atrial fibrillation (HCC) Status post PVI ablation in June 2023, maintaining sinus rhythm. No A-fib  alerts from [employer].  He is tolerating anticoagulation well.  Labs from [facility] reviewed.  Hemoglobin in April 2024 was normal at 15.4.  In April 2024, creatinine normal at 1.0. -Continue Eliquis 5mg  twice daily and Metoprolol Succinate 50mg  daily. -Follow-up with EP/A-fib clinic as planned -Follow-up with general cardiology in June 2025 Primary hypertension Blood pressure controlled. -Continue amlodipine 5 mg daily, metoprolol succinate 50 mg daily Pure hypercholesterolemia LDL goal should be less than 70. Patient prefers not to increase medication doses and would like to try diet, exercise, and weight loss. -Continue Crestor 10mg  daily. If LDL continues to remain above 70 despite lifestyle  modifications, consider increasing Crestor to 20mg  daily. OSA (obstructive sleep apnea) Addendum 07/26/23: The pt had his sleep study in 2023. He had previously declined titration. After his visit, I reached out to our staff and was able to confirm that he was approved for titration. He was agreeable to proceed and will be set up for CPAP titration.          Dispo:  Return in about 8 months (around 11/25/2023) for Routine Follow Up, w/ Dr. Izora Ribas, or Tereso Newcomer, PA-C.  Signed, Tereso Newcomer, PA-C

## 2023-03-27 NOTE — Assessment & Plan Note (Signed)
Status post PVI ablation in June 2023, maintaining sinus rhythm. No A-fib alerts from [employer].  He is tolerating anticoagulation well.  Labs from [facility] reviewed.  Hemoglobin in April 2024 was normal at 15.4.  In April 2024, creatinine normal at 1.0. -Continue Eliquis 5mg  twice daily and Metoprolol Succinate 50mg  daily. -Follow-up with EP/A-fib clinic as planned -Follow-up with general cardiology in June 2025

## 2023-03-27 NOTE — Assessment & Plan Note (Signed)
Blood pressure controlled. -Continue amlodipine 5 mg daily, metoprolol succinate 50 mg daily

## 2023-03-27 NOTE — Assessment & Plan Note (Signed)
LDL goal should be less than 70. Patient prefers not to increase medication doses and would like to try diet, exercise, and weight loss. -Continue Crestor 10mg  daily. If LDL continues to remain above 70 despite lifestyle modifications, consider increasing Crestor to 20mg  daily.

## 2023-03-27 NOTE — Assessment & Plan Note (Signed)
Minimal plaque on coronary CTA in 2020 and somewhat elevated calcium score in June 2023. No chest symptoms to suggest angina.  He is not on antiplatelet therapy as he is on Eliquis. -Continue Crestor 10mg  daily.

## 2023-03-29 ENCOUNTER — Other Ambulatory Visit: Payer: Self-pay

## 2023-03-29 DIAGNOSIS — I4819 Other persistent atrial fibrillation: Secondary | ICD-10-CM

## 2023-03-29 MED ORDER — AMLODIPINE BESYLATE 5 MG PO TABS: ORAL_TABLET | ORAL | 3 refills | Status: DC

## 2023-03-29 MED ORDER — APIXABAN 5 MG PO TABS: 5.0000 mg | ORAL_TABLET | Freq: Two times a day (BID) | ORAL | 1 refills | Status: DC

## 2023-03-29 NOTE — Telephone Encounter (Signed)
Prescription refill request for Eliquis received. Indication: Afib  Last office visit: 03/27/23 Alben Spittle)  Scr: 0.85 (04/02/22)  Age: 62 Weight: 120.2kg  Appropriate dose. Refill sent.

## 2023-04-04 ENCOUNTER — Telehealth: Payer: Self-pay

## 2023-04-04 DIAGNOSIS — G4733 Obstructive sleep apnea (adult) (pediatric): Secondary | ICD-10-CM

## 2023-04-04 NOTE — Telephone Encounter (Signed)
**Note De-Identified Giannina Bartolome Obfuscation** I called [employer] and was advised by Boneta Lucks that a PA is not required for CPT Code: 65784 (CPCP Titration). Reference #:[id number].

## 2023-04-08 NOTE — Telephone Encounter (Signed)
Great! Thank you! How can we get his CPAP titration arranged? James Weeks

## 2023-04-09 NOTE — Telephone Encounter (Signed)
Thank you! Tereso Newcomer, PA-C    04/09/2023 2:04 PM

## 2023-05-01 ENCOUNTER — Other Ambulatory Visit (HOSPITAL_COMMUNITY): Payer: Self-pay | Admitting: Physician Assistant

## 2023-05-03 DIAGNOSIS — L821 Other seborrheic keratosis: Secondary | ICD-10-CM | POA: Diagnosis not present

## 2023-05-03 DIAGNOSIS — D225 Melanocytic nevi of trunk: Secondary | ICD-10-CM | POA: Diagnosis not present

## 2023-05-03 DIAGNOSIS — L578 Other skin changes due to chronic exposure to nonionizing radiation: Secondary | ICD-10-CM | POA: Diagnosis not present

## 2023-05-09 DIAGNOSIS — H6693 Otitis media, unspecified, bilateral: Secondary | ICD-10-CM | POA: Diagnosis not present

## 2023-06-25 DIAGNOSIS — Z125 Encounter for screening for malignant neoplasm of prostate: Secondary | ICD-10-CM | POA: Diagnosis not present

## 2023-06-25 DIAGNOSIS — I10 Essential (primary) hypertension: Secondary | ICD-10-CM | POA: Diagnosis not present

## 2023-06-25 DIAGNOSIS — Z0000 Encounter for general adult medical examination without abnormal findings: Secondary | ICD-10-CM | POA: Diagnosis not present

## 2023-06-25 DIAGNOSIS — E785 Hyperlipidemia, unspecified: Secondary | ICD-10-CM | POA: Diagnosis not present

## 2023-06-25 DIAGNOSIS — Z79899 Other long term (current) drug therapy: Secondary | ICD-10-CM | POA: Diagnosis not present

## 2023-06-25 DIAGNOSIS — R7303 Prediabetes: Secondary | ICD-10-CM | POA: Diagnosis not present

## 2023-07-17 ENCOUNTER — Ambulatory Visit (HOSPITAL_BASED_OUTPATIENT_CLINIC_OR_DEPARTMENT_OTHER): Payer: BC Managed Care – PPO | Attending: Cardiology | Admitting: Cardiology

## 2023-07-17 VITALS — Ht 73.0 in | Wt 270.0 lb

## 2023-07-17 DIAGNOSIS — I493 Ventricular premature depolarization: Secondary | ICD-10-CM | POA: Insufficient documentation

## 2023-07-17 DIAGNOSIS — G4733 Obstructive sleep apnea (adult) (pediatric): Secondary | ICD-10-CM | POA: Insufficient documentation

## 2023-07-18 NOTE — Procedures (Signed)
   Patient Name: James Weeks, James Weeks Date: 07/17/2023 Gender: Male D.O.B: 09/09/1960 Age (years): 76 Referring Provider: Laurance Flatten MD Height (inches): 73 Interpreting Physician: Armanda Magic MD, ABSM Weight (lbs): 270 RPSGT: Lowry Ram BMI: 36 MRN: [id number] Neck Size: 18.00  CLINICAL INFORMATION The patient is referred for a CPAP titration to treat sleep apnea.  SLEEP STUDY TECHNIQUE As per the AASM Manual for the Scoring of Sleep and Associated Events v2.3 (April 2016) with a hypopnea requiring 4% desaturations.  The channels recorded and monitored were frontal, central and occipital EEG, electrooculogram (EOG), submentalis EMG (chin), nasal and oral airflow, thoracic and abdominal wall motion, anterior tibialis EMG, snore microphone, electrocardiogram, and pulse oximetry. Continuous positive airway pressure (CPAP) was initiated at the beginning of the study and titrated to treat sleep-disordered breathing.  MEDICATIONS Medications self-administered by patient taken the night of the study : N/A  TECHNICIAN COMMENTS Comments added by technician: Patient was restless all through the night. Patient had difficulty initiating sleep. Comments added by scorer: N/A  RESPIRATORY PARAMETERS Optimal PAP Pressure (cm):12  AHI at Optimal Pressure (/hr):0 Overall Minimal O2 (%):87.0  Supine % at Optimal Pressure (%):16 Minimal O2 at Optimal Pressure (%): 89.0   SLEEP ARCHITECTURE The study was initiated at 10:13:50 PM and ended at 5:16:05 AM.  Sleep onset time was 24.0 minutes and the sleep efficiency was 58.5%. The total sleep time was 247 minutes.  The patient spent 8.7% of the night in stage N1 sleep, 82.0% in stage N2 sleep, 0.0% in stage N3 and 9.3% in REM.Stage REM latency was 105.5 minutes  Wake after sleep onset was 151.3. Alpha intrusion was absent. Supine sleep was 41.68%.  CARDIAC DATA The 2 lead EKG demonstrated sinus rhythm. The mean heart rate was  55.3 beats per minute. Other EKG findings include: PVCs.  LEG MOVEMENT DATA The total Periodic Limb Movements of Sleep (PLMS) were 0. The PLMS index was 0.0. A PLMS index of <15 is considered normal in adults.  IMPRESSIONS - The optimal PAP pressure was 12 cm of water. - Mild oxygen desaturations were observed during this titration (min O2 = 87.0%). - The patient snored with moderate snoring volume during this titration study. - 2-lead EKG demonstrated: PVCs - Clinically significant periodic limb movements were not noted during this study. Arousals associated with PLMs were significant.  DIAGNOSIS - Obstructive Sleep Apnea (G47.33)  RECOMMENDATIONS - Trial of ResMed CPAP therapy on 12 cm H2O with a Small-Medium size Fisher&Paykel Full Face Evora Full mask and heated humidification. - Avoid alcohol, sedatives and other CNS depressants that may worsen sleep apnea and disrupt normal sleep architecture. - Sleep hygiene should be reviewed to assess factors that may improve sleep quality. - Weight management and regular exercise should be initiated or continued. - Return to Sleep Center for re-evaluation after 4 weeks of therapy  [Electronically signed] 07/18/2023 05:38 PM  Armanda Magic MD, ABSM Diplomate, American Board of Sleep Medicine

## 2023-07-19 ENCOUNTER — Telehealth: Payer: Self-pay

## 2023-07-19 NOTE — Telephone Encounter (Signed)
Notified patient of Titration results and recommendations. All questions were answered and patient verbalized understanding. Order for PAP device and supplies sent to [facility] today 07/19/23.

## 2023-07-19 NOTE — Telephone Encounter (Signed)
-----   Message from Armanda Magic sent at 07/18/2023  5:45 PM EST ----- Please let patient know that they had a successful PAP titration and let DME know that orders are in EPIC.  Please set up 6 week OV with me.

## 2023-07-26 NOTE — Assessment & Plan Note (Signed)
Addendum 07/26/23: The pt had his sleep study in 2023. He had previously declined titration. After his visit, I reached out to our staff and was able to confirm that he was approved for titration. He was agreeable to proceed and will be set up for CPAP titration.

## 2023-07-29 ENCOUNTER — Other Ambulatory Visit (HOSPITAL_COMMUNITY): Payer: Self-pay | Admitting: Physician Assistant

## 2023-08-23 DIAGNOSIS — G4733 Obstructive sleep apnea (adult) (pediatric): Secondary | ICD-10-CM | POA: Diagnosis not present

## 2023-09-04 DIAGNOSIS — H524 Presbyopia: Secondary | ICD-10-CM | POA: Diagnosis not present

## 2023-09-20 DIAGNOSIS — G4733 Obstructive sleep apnea (adult) (pediatric): Secondary | ICD-10-CM | POA: Diagnosis not present

## 2023-09-23 DIAGNOSIS — C61 Malignant neoplasm of prostate: Secondary | ICD-10-CM | POA: Diagnosis not present

## 2023-09-23 DIAGNOSIS — E291 Testicular hypofunction: Secondary | ICD-10-CM | POA: Diagnosis not present

## 2023-10-01 DIAGNOSIS — C61 Malignant neoplasm of prostate: Secondary | ICD-10-CM | POA: Diagnosis not present

## 2023-10-01 DIAGNOSIS — E349 Endocrine disorder, unspecified: Secondary | ICD-10-CM | POA: Diagnosis not present

## 2023-10-01 DIAGNOSIS — R350 Frequency of micturition: Secondary | ICD-10-CM | POA: Diagnosis not present

## 2023-11-04 ENCOUNTER — Ambulatory Visit: Attending: Cardiology | Admitting: Cardiology

## 2023-11-04 ENCOUNTER — Encounter: Payer: Self-pay | Admitting: Cardiology

## 2023-11-04 VITALS — BP 140/82 | HR 69 | Ht 73.0 in | Wt 278.0 lb

## 2023-11-04 DIAGNOSIS — I10 Essential (primary) hypertension: Secondary | ICD-10-CM

## 2023-11-04 DIAGNOSIS — G4733 Obstructive sleep apnea (adult) (pediatric): Secondary | ICD-10-CM | POA: Diagnosis not present

## 2023-11-04 NOTE — Progress Notes (Signed)
 Sleep Medicine CONSULT Note    Date:  11/04/2023   ID:  James Weeks, DOB 01-22-1961, MRN [id number]  PCP:  Barney Boozer, NP  Cardiologist: Jann Melody, MD   Chief Complaint  Patient presents with   New Patient (Initial Visit)    Obstructive sleep apnea    History of Present Illness:  James Weeks is a 63 y.o. male who is being seen today for the evaluation of OSA at the request of Lambert Pillion, MD.  This is a 63 year old male with a history of emphysema, hyperlipidemia, hypertension, CAD, PAF and prostate CA.  He was seen by Dr. Lawana Pray 10/09/2021 and complained of snoring and witnessed apneas by his wife.  Due to his atrial fibrillation a sleep study was ordered.  He underwent NPSG 11/03/2021 which showed mild obstructive sleep apnea with an AHI of 10.7/h and nocturnal hypoxemia with O2 sats as low as 76%.  CPAP titration was recommended.  Some time went by but finally got his CPAP titration on 07/17/2023 and successfully titrated to 12 cm H2O.  He is now referred for sleep medicine consultation to establish sleep care and treatment of obstructive sleep apnea  He is doing well with his PAP device and thinks that he has gotten used to it.  He tolerates the under the nose full face mask and feels the pressure is adequate.  He is now sleeping better at night and dreams more and sleeps through the night. Since going on PAP he feels rested in the am and has no significant daytime sleepiness.  He denies any significant mouth or nasal dryness or nasal congestion.  He does not think that he snores.    Past Medical History:  Diagnosis Date   Allergic rhinitis due to pollen    Anticoagulant long-term use    eliquis -- managed by cardiology   Anxiety    Emphysema lung (HCC)    per chest CT in epic 09-29-2020   Hyperlipidemia    Hypertension    Mild CAD    cardiologist--- dr Ardell Beauvais---  per coronary CT 09-05-2018 mild cad involving midLAD, moderate torturous  coronary arteries, calcium  score =1   PAF (paroxysmal atrial fibrillation) [facility])    cardiologist-- dr Ardell Beauvais-- dx 03/ 2021 asymptomatic   Prostate cancer [facility])    urologist--- dr gay--- dx 04/ 2020 Gleason 3+3 active survillence;  bx 12/ 2020 ,Gleason3+4 survillence;  MRI fusion bx 04/ 2022 Gleason 3+4   Pulmonary nodules    last chest CT in epic 09-29-2020 bilateral upper lobe nodules stable, benign    Past Surgical History:  Procedure Laterality Date   ATRIAL FIBRILLATION ABLATION N/A 11/30/2021   Procedure: ATRIAL FIBRILLATION ABLATION;  Surgeon: Lei Pump, MD;  Location: [facility] INVASIVE CV LAB;  Service: Cardiovascular;  Laterality: N/A;   CARDIOVERSION N/A 10/30/2019   Procedure: CARDIOVERSION;  Surgeon: Elmyra Haggard, MD;  Location: [facility] ENDOSCOPY;  Service: Cardiovascular;  Laterality: N/A;   CARDIOVERSION N/A 09/26/2021   Procedure: CARDIOVERSION;  Surgeon: Loyde Rule, MD;  Location: [facility] ENDOSCOPY;  Service: Cardiovascular;  Laterality: N/A;   COLONOSCOPY WITH PROPOFOL   2012   PROSTATE BIOPSY     RADIOACTIVE SEED IMPLANT N/A 02/06/2021   Procedure: RADIOACTIVE SEED IMPLANT/BRACHYTHERAPY IMPLANT WITH CYSTOSCOPY;  Surgeon: Lahoma Pigg, MD;  Location: [facility];  Service: Urology;  Laterality: N/A;  74 seeds; No seeds detected in bladder per Dr. Freddi Jaeger    Current Medications: Current Meds  Medication Sig   acetaminophen  (TYLENOL ) 325 MG tablet Take 650 mg by mouth every 6 (six) hours as needed for moderate pain.   amLODipine  (NORVASC ) 5 MG tablet TAKE 1 TABLET BY MOUTH EVERYDAY AT BEDTIME   apixaban  (ELIQUIS ) 5 MG TABS tablet Take 1 tablet (5 mg total) by mouth 2 (two) times daily.   diazepam (VALIUM) 10 MG tablet Take 5 mg by mouth daily as needed for anxiety.    fluticasone (FLONASE) 50 MCG/ACT nasal spray Place 50 mcg into the nose daily as needed for allergies or rhinitis.   loratadine (CLARITIN) 10 MG tablet Take 10 mg by mouth daily as needed for  allergies.   metoprolol  succinate (TOPROL -XL) 50 MG 24 hr tablet Take 1 tablet (50 mg total) by mouth daily. Appointment Required For Further Refills [phone]   rosuvastatin  (CRESTOR ) 20 MG tablet Take 0.5 tablets (10 mg total) by mouth daily.   testosterone  cypionate (DEPOTESTOSTERONE CYPIONATE) 200 MG/ML injection 0.5 mLs.    Allergies:   Patient has no known allergies.   Social History   Socioeconomic History   Marital status: Married    Spouse name: Not on file   Number of children: 2   Years of education: Not on file   Highest education level: Not on file  Occupational History   Not on file  Tobacco Use   Smoking status: Some Days    Types: Cigars   Smokeless tobacco: Former    Types: Chew    Quit date: 1985   Tobacco comments:    02-02-2021  pt quit cigarette smoking 2012, has continued occasional cigar smoking  Vaping Use   Vaping status: Never Used  Substance and Sexual Activity   Alcohol use: Yes    Comment: occasionally   Drug use: Never   Sexual activity: Yes  Other Topics Concern   Not on file  Social History Narrative   Not on file   Social Drivers of Health   Financial Resource Strain: Low Risk  (04/27/2021)   Overall Financial Resource Strain (CARDIA)    Difficulty of Paying Living Expenses: Not hard at all  Food Insecurity: No Food Insecurity (04/27/2021)   Hunger Vital Sign    Worried About Running Out of Food in the Last Year: Never true    Ran Out of Food in the Last Year: Never true  Transportation Needs: No Transportation Needs (04/27/2021)   PRAPARE - Administrator, Civil Service (Medical): No    Lack of Transportation (Non-Medical): No  Physical Activity: Sufficiently Active (04/27/2021)   Exercise Vital Sign    Days of Exercise per Week: 5 days    Minutes of Exercise per Session: 60 min  Stress: No Stress Concern Present (04/27/2021)   [employer] of Occupational Health - Occupational Stress Questionnaire    Feeling of  Stress : Only a little  Social Connections: Moderately Isolated (04/27/2021)   Social Connection and Isolation Panel [NHANES]    Frequency of Communication with Friends and Family: More than three times a week    Frequency of Social Gatherings with Friends and Family: Twice a week    Attends Religious Services: Never    Database administrator or Organizations: No    Attends Engineer, structural: Never    Marital Status: Married     Family History:  The patient's family history includes Asthma in his brother and father; Breast cancer in his maternal aunt and mother; CVA in an other family member;  Cancer in his maternal grandmother and paternal grandmother; Diabetes in his mother; Heart attack in an other family member; Heart disease in his father and another family member; Hypertension in his mother and another family member; Lung cancer in his maternal grandfather; Other (age of onset: 30) in his father; Prostate cancer in his paternal grandfather and paternal uncle.   ROS:   Please see the history of present illness.    ROS All other systems reviewed and are negative.      No data to display             PHYSICAL EXAM:   VS:  BP (!) 140/82   Pulse 69   Ht 6\' 1"  (1.854 m)   Wt 278 lb (126.1 kg)   SpO2 97%   BMI 36.68 kg/m    GEN: Well nourished, well developed, in no acute distress  HEENT: normal  Neck: no JVD, carotid bruits, or masses Cardiac: RRR; no murmurs, rubs, or gallops,no edema.  Intact distal pulses bilaterally.  Respiratory:  clear to auscultation bilaterally, normal work of breathing GI: soft, nontender, nondistended, + BS MS: no deformity or atrophy  Skin: warm and dry, no rash Neuro:  Alert and Oriented x 3, Strength and sensation are intact Psych: euthymic mood, full affect  Wt Readings from Last 3 Encounters:  11/04/23 278 lb (126.1 kg)  07/17/23 270 lb (122.5 kg)  03/27/23 265 lb (120.2 kg)      Studies/Labs Reviewed:   NPSG, CPAP  titration, PAP compliance download  Recent Labs: No results found for requested labs within last 365 days.    CHA2DS2-VASc Score = 2   This indicates a 2.2% annual risk of stroke. The patient's score is based upon: CHF History: 0 HTN History: 1 Diabetes History: 0 Stroke History: 0 Vascular Disease History: 1 Age Score: 0 Gender Score: 0     ASSESSMENT:    1. OSA (obstructive sleep apnea)   2. Primary hypertension      PLAN:  In order of problems listed above:  OSA - The patient is tolerating PAP therapy well without any problems. The PAP download performed by his DME was personally reviewed and interpreted by me today and showed an AHI of 5.2 /hr on 12 cm H2O with 97% compliance in using more than 4 hours nightly.  The patient has been using and benefiting from PAP use and will continue to benefit from therapy.  - I am going to increase his CPAP to 13 cm H2O to try to get AHI < 5/h - Check Ono on CPAP to make sure there is no residual nocturnal hypoxemia  Hypertension - BP controlled on exam today - Continue amlodipine  5 mg daily, Toprol -XL 50 mg daily with as needed refills  Seasonal Allergies -he takes allergy medicine as well as Flonase -I recommended starting nasal saline spray 2 sprays each nostril BID   Time Spent: 20 minutes total time of encounter, including 15 minutes spent in face-to-face patient care on the date of this encounter. This time includes coordination of care and counseling regarding above mentioned problem list. Remainder of non-face-to-face time involved reviewing chart documents/testing relevant to the patient encounter and documentation in the medical record. I have independently reviewed documentation from referring provider  Medication Adjustments/Labs and Tests Ordered: Current medicines are reviewed at length with the patient today.  Concerns regarding medicines are outlined above.  Medication changes, Labs and Tests ordered today are  listed in the Patient Instructions below.  There are no Patient Instructions on file for this visit.   Signed, Gaylyn Keas, MD  11/04/2023 8:12 AM    [facility] [address] [place], [place], [place]  [address] Phone: [phone]; Fax: [phone]

## 2023-11-04 NOTE — Patient Instructions (Signed)
 Medication Instructions:  Your physician recommends that you continue on your current medications as directed. Please refer to the Current Medication list given to you today.  *If you need a refill on your cardiac medications before your next appointment, please call your pharmacy*  Lab Work: None.  If you have labs (blood work) drawn today and your tests are completely normal, you will receive your results only by: MyChart Message (if you have MyChart) OR A paper copy in the mail If you have any lab test that is abnormal or we need to change your treatment, we will call you to review the results.  Testing/Procedures: None.  Follow-Up: At [facility], you and your health needs are our priority.  As part of our continuing mission to provide you with exceptional heart care, our providers are all part of one team.  This team includes your primary Cardiologist (physician) and Advanced Practice Providers or APPs (Physician Assistants and Nurse Practitioners) who all work together to provide you with the care you need, when you need it.  Your next appointment:   1 year(s)  Provider:   Dr. Gaylyn Keas, MD

## 2023-11-13 ENCOUNTER — Telehealth: Payer: Self-pay | Admitting: Physician Assistant

## 2023-11-13 DIAGNOSIS — I4819 Other persistent atrial fibrillation: Secondary | ICD-10-CM

## 2023-11-13 MED ORDER — APIXABAN 5 MG PO TABS: 5.0000 mg | ORAL_TABLET | Freq: Two times a day (BID) | ORAL | 1 refills | Status: DC

## 2023-11-13 NOTE — Telephone Encounter (Signed)
 Pt last saw Dr Micael Adas 11/04/23, last labs 06/25/23 Creat 0.87 at [facility] per [facility], age 63, weight 126.1kg, based on specified criteria pt is on appropriate dosage of Eliquis  5mg  BID for afib.  Will refill rx.

## 2023-11-13 NOTE — Telephone Encounter (Signed)
*  STAT* If patient is at the pharmacy, call can be transferred to refill team.   1. Which medications need to be refilled? (please list name of each medication and dose if known) Eliquis    2. Would you like to learn more about the convenience, safety, & potential cost savings by using the [facility]?   3. Are you open to using the [facility] (Type [facility]. .   4. Which pharmacy/location (including street and city if local pharmacy) is medication to be sent to?[facility] [address] [place]   5. Do they need a 30 day or 90 day supply?  90 days and refills

## 2023-11-20 DIAGNOSIS — G4733 Obstructive sleep apnea (adult) (pediatric): Secondary | ICD-10-CM | POA: Diagnosis not present

## 2023-11-29 DIAGNOSIS — G4733 Obstructive sleep apnea (adult) (pediatric): Secondary | ICD-10-CM | POA: Diagnosis not present

## 2023-12-02 ENCOUNTER — Telehealth: Payer: Self-pay

## 2023-12-02 DIAGNOSIS — G4733 Obstructive sleep apnea (adult) (pediatric): Secondary | ICD-10-CM

## 2023-12-02 DIAGNOSIS — E7800 Pure hypercholesterolemia, unspecified: Secondary | ICD-10-CM

## 2023-12-02 DIAGNOSIS — I4819 Other persistent atrial fibrillation: Secondary | ICD-10-CM

## 2023-12-02 DIAGNOSIS — I10 Essential (primary) hypertension: Secondary | ICD-10-CM

## 2023-12-02 DIAGNOSIS — I2510 Atherosclerotic heart disease of native coronary artery without angina pectoris: Secondary | ICD-10-CM

## 2023-12-02 NOTE — Telephone Encounter (Signed)
 Order for Check ONO on CPAP to make sure there is no residual nocturnal hypoxemia sent to [facility] today.

## 2023-12-02 NOTE — Telephone Encounter (Signed)
-----   Message from Gaylyn Keas sent at 11/04/2023  8:22 AM EDT ----- increase his CPAP to 13 cm H2O  - Check Ono on CPAP to make sure there is no residual nocturnal hypoxemia

## 2023-12-04 ENCOUNTER — Encounter: Payer: Self-pay | Admitting: Internal Medicine

## 2023-12-23 DIAGNOSIS — I10 Essential (primary) hypertension: Secondary | ICD-10-CM | POA: Diagnosis not present

## 2023-12-23 DIAGNOSIS — F419 Anxiety disorder, unspecified: Secondary | ICD-10-CM | POA: Diagnosis not present

## 2023-12-23 DIAGNOSIS — I4891 Unspecified atrial fibrillation: Secondary | ICD-10-CM | POA: Diagnosis not present

## 2023-12-23 DIAGNOSIS — Z79899 Other long term (current) drug therapy: Secondary | ICD-10-CM | POA: Diagnosis not present

## 2023-12-23 DIAGNOSIS — E785 Hyperlipidemia, unspecified: Secondary | ICD-10-CM | POA: Diagnosis not present

## 2023-12-23 DIAGNOSIS — R7303 Prediabetes: Secondary | ICD-10-CM | POA: Diagnosis not present

## 2024-01-15 ENCOUNTER — Other Ambulatory Visit (HOSPITAL_COMMUNITY): Payer: Self-pay | Admitting: Physician Assistant

## 2024-01-21 DIAGNOSIS — G4736 Sleep related hypoventilation in conditions classified elsewhere: Secondary | ICD-10-CM | POA: Diagnosis not present

## 2024-01-28 ENCOUNTER — Encounter: Payer: Self-pay | Admitting: Cardiology

## 2024-02-03 DIAGNOSIS — R635 Abnormal weight gain: Secondary | ICD-10-CM | POA: Diagnosis not present

## 2024-02-03 DIAGNOSIS — B029 Zoster without complications: Secondary | ICD-10-CM | POA: Diagnosis not present

## 2024-02-03 DIAGNOSIS — G4733 Obstructive sleep apnea (adult) (pediatric): Secondary | ICD-10-CM | POA: Diagnosis not present

## 2024-02-16 NOTE — Assessment & Plan Note (Signed)
 Status post PVI ablation in June 2023. Currently maintaining sinus rhythm with no significant symptoms. Recent hemoglobin and creatinine levels are normal. - Continue Eliquis  5 mg twice daily - Continue Toprol  XL 50 mg daily - Follow up with AFib clinic in 6 months

## 2024-02-16 NOTE — Assessment & Plan Note (Signed)
 Minimal plaque on coronary CTA in 2020 and somewhat elevated calcium  score in June 2023. He has rare chest pain related to anxiety. No exertional symptoms. No further testing needed at this time.  -Continue Rosuvastatin  >> reduced dose to 10 mg MWF due to side effects  -Follow up 1 year.

## 2024-02-16 NOTE — Assessment & Plan Note (Signed)
 He has est with Dr. Shlomo and is now on CPAP. ***

## 2024-02-16 NOTE — Progress Notes (Unsigned)
 OFFICE NOTE:    Date:  02/17/2024  ID:  Franky JAYSON East, DOB [birth year], MRN [id number] PCP: Pandora Therisa RAMAN, NP  [facility] Providers Cardiologist:  Stanly DELENA Leavens, MD Cardiology APP:  Lelon Glendia DASEN, PA-C  Electrophysiologist:  Will Gladis Norton, MD  Sleep Medicine:  Wilbert Bihari, MD       Persistent atrial fibrillation TTE 09/23/2019: EF 50-55, no RWMA, normal RVSF, RVSP 26.4, mild LAE, trivial MR, mild AV sclerosis, RAP 3 S/p failed cardioversion S/p PVI ablation 11/2021 Coronary artery disease  CCTA 09/05/2018: CAC score 1 (35th percentile); LAD mid 25-49; ascending aorta 36 mm Pre-A-fib ablation CCTA 11/23/2021: CAC score 20 (43rd percentile) Hypertension Lung nodule Emphysema Hyperlipidemia Prostate CA Sleep apnea        Discussed the use of AI scribe software for clinical note transcription with the patient, who gave verbal consent to proceed. History of Present Illness James Weeks is a 63 y.o. male who returns for follow up of CAD, AFib. He was last seen in 03/2023.   He experiences rare chest discomfort, which he associates with anxiety rather than exertion, and notes that the pressure he used to feel has significantly decreased since the A-fib ablation.  He reports joint soreness and stiffness, particularly in his back, legs, and hands, which he attributes to the rosuvastatin . He is active and works with his hands, noting that his symptoms improve with movement but worsen by the end of the day. No shortness of breath, syncope, black stools, or bloody urine.     Review of Systems  Gastrointestinal:  Negative for hematochezia and melena.  Genitourinary:  Negative for hematuria.  -See HPI    Studies Reviewed:  EKG Interpretation Date/Time:  Monday February 17 2024 11:11:42 EDT Ventricular Rate:  70 PR Interval:  168 QRS Duration:  92 QT Interval:  400 QTC Calculation: 432 R Axis:   -22  Text Interpretation: Normal sinus rhythm  Normal ECG No significant change since last tracing Confirmed by Lelon Glendia [phone]) on 02/17/2024 11:48:25 AM   Results LABS - Per [facility] personally reviewed and interpreted by me today 02/17/2024  Total cholesterol: 154 (12/23/2023) HDL: 35 (12/23/2023) LDL: 72 (12/23/2023) Triglycerides: 289 (12/23/2023) Hemoglobin: 15.2 (12/23/2023) Creatinine: 1.09 (12/23/2023) Potassium: 3.8 (12/23/2023) ALT: 18 (12/23/2023)  Risk Assessment/Calculations:  CHA2DS2-VASc Score = 2   This indicates a 2.2% annual risk of stroke. The patient's score is based upon: CHF History: 0 HTN History: 1 Diabetes History: 0 Stroke History: 0 Vascular Disease History: 1 Age Score: 0 Gender Score: 0           Physical Exam:  VS:  BP 126/84   Pulse 70   Ht 6' 1 (1.854 m)   Wt 278 lb 9.6 oz (126.4 kg)   SpO2 98%   BMI 36.76 kg/m        Wt Readings from Last 3 Encounters:  02/17/24 278 lb 9.6 oz (126.4 kg)  11/04/23 278 lb (126.1 kg)  07/17/23 270 lb (122.5 kg)    Constitutional:      Appearance: Healthy appearance. Not in distress.  Neck:     Vascular: JVD normal.  Pulmonary:     Breath sounds: Normal breath sounds. No wheezing. No rales.  Cardiovascular:     Normal rate. Regular rhythm.     Murmurs: There is no murmur.  Edema:    Peripheral edema absent.  Abdominal:     Palpations: Abdomen is soft.  Assessment and Plan:    Assessment & Plan Coronary artery disease involving native coronary artery of native heart without angina pectoris Minimal plaque on coronary CTA in 2020 and somewhat elevated calcium  score in June 2023. He has rare chest pain related to anxiety. No exertional symptoms. No further testing needed at this time.  -Continue Rosuvastatin  >> reduced dose to 10 mg MWF due to side effects  -Follow up 1 year.  Persistent atrial fibrillation (HCC) Status post PVI ablation in June 2023. Currently maintaining sinus rhythm with no significant symptoms. Recent hemoglobin  and creatinine levels are normal. - Continue Eliquis  5 mg twice daily - Continue Toprol  XL 50 mg daily - Follow up with AFib clinic in 6 months Primary hypertension Blood pressure is well-controlled. - Continue amlodipine  5 mg daily - Continue Toprol  XL 50 mg daily Pure hypercholesterolemia Hyperlipidemia with recent LDL above 70 and elevated triglycerides. Intolerance to rosuvastatin  due to arthralgias. Current LDL goal is less than 70. Discussed alternative options including reducing rosuvastatin  frequency, adding ezetimibe, or considering PCSK9 inhibitors.   - Change rosuvastatin  to 10 mg every Monday, Wednesday, and Friday - Monitor response to rosuvastatin  dosing change - Consider adding ezetimibe or PCSK9 inhibitor if needed - He will need follow up lipids after changing dosing to three days a week (if tolerated) OSA (obstructive sleep apnea) He has est with Dr. Shlomo and is now on CPAP.          Dispo:  Return in about 1 year (around 02/16/2025) for Routine Follow Up, w/ Dr. Santo, or Glendia Ferrier, PA-C.  Signed, Glendia Ferrier, PA-C

## 2024-02-17 ENCOUNTER — Encounter: Payer: Self-pay | Admitting: Physician Assistant

## 2024-02-17 ENCOUNTER — Ambulatory Visit: Attending: Physician Assistant | Admitting: Physician Assistant

## 2024-02-17 VITALS — BP 126/84 | HR 70 | Ht 73.0 in | Wt 278.6 lb

## 2024-02-17 DIAGNOSIS — I2510 Atherosclerotic heart disease of native coronary artery without angina pectoris: Secondary | ICD-10-CM

## 2024-02-17 DIAGNOSIS — I4819 Other persistent atrial fibrillation: Secondary | ICD-10-CM | POA: Diagnosis not present

## 2024-02-17 DIAGNOSIS — I10 Essential (primary) hypertension: Secondary | ICD-10-CM | POA: Diagnosis not present

## 2024-02-17 DIAGNOSIS — E7800 Pure hypercholesterolemia, unspecified: Secondary | ICD-10-CM | POA: Diagnosis not present

## 2024-02-17 DIAGNOSIS — G4733 Obstructive sleep apnea (adult) (pediatric): Secondary | ICD-10-CM

## 2024-02-17 NOTE — Assessment & Plan Note (Signed)
 Hyperlipidemia with recent LDL above 70 and elevated triglycerides. Intolerance to rosuvastatin  due to arthralgias. Current LDL goal is less than 70. Discussed alternative options including reducing rosuvastatin  frequency, adding ezetimibe, or considering PCSK9 inhibitors.   - Change rosuvastatin  to 10 mg every Monday, Wednesday, and Friday - Monitor response to rosuvastatin  dosing change - Consider adding ezetimibe or PCSK9 inhibitor if needed - He will need follow up lipids after changing dosing to three days a week (if tolerated)

## 2024-02-17 NOTE — Patient Instructions (Signed)
 Medication Instructions:  Your physician has recommended you make the following change in your medication: TRY taking the Rosuvastatin  Monday's, Wednesday's, & Friday's.  Let us  know how it goes  *If you need a refill on your cardiac medications before your next appointment, please call your pharmacy*  Lab Work: None ordered  If you have labs (blood work) drawn today and your tests are completely normal, you will receive your results only by: MyChart Message (if you have MyChart) OR A paper copy in the mail If you have any lab test that is abnormal or we need to change your treatment, we will call you to review the results.  Testing/Procedures: None ordered  Follow-Up: At [facility], you and your health needs are our priority.  As part of our continuing mission to provide you with exceptional heart care, our providers are all part of one team.  This team includes your primary Cardiologist (physician) and Advanced Practice Providers or APPs (Physician Assistants and Nurse Practitioners) who all work together to provide you with the care you need, when you need it.  Your next appointment:   6 month(s) 12 month(s)   Provider:   You will follow up in the Atrial Fibrillation Clinic located at [facility]. Your provider will be: Clint R. Fenton, PA-C or Fairy Heinrich, PA-C   Mahesh DELENA Leavens, MD or Glendia Ferrier, PA-C        We recommend signing up for the patient portal called MyChart.  Sign up information is provided on this After Visit Summary.  MyChart is used to connect with patients for Virtual Visits (Telemedicine).  Patients are able to view lab/test results, encounter notes, upcoming appointments, etc.  Non-urgent messages can be sent to your provider as well.   To learn more about what you can do with MyChart, go to [email or web address].   Other Instructions

## 2024-02-17 NOTE — Assessment & Plan Note (Signed)
 Blood pressure is well-controlled. - Continue amlodipine  5 mg daily - Continue Toprol  XL 50 mg daily

## 2024-02-27 DIAGNOSIS — E7800 Pure hypercholesterolemia, unspecified: Secondary | ICD-10-CM

## 2024-02-27 DIAGNOSIS — I2510 Atherosclerotic heart disease of native coronary artery without angina pectoris: Secondary | ICD-10-CM

## 2024-04-07 DIAGNOSIS — C61 Malignant neoplasm of prostate: Secondary | ICD-10-CM | POA: Diagnosis not present

## 2024-04-11 ENCOUNTER — Other Ambulatory Visit: Payer: Self-pay | Admitting: Internal Medicine

## 2024-04-13 ENCOUNTER — Other Ambulatory Visit: Payer: Self-pay | Admitting: Physician Assistant

## 2024-04-13 DIAGNOSIS — C61 Malignant neoplasm of prostate: Secondary | ICD-10-CM | POA: Diagnosis not present

## 2024-04-13 DIAGNOSIS — E291 Testicular hypofunction: Secondary | ICD-10-CM | POA: Diagnosis not present

## 2024-04-16 MED ORDER — AMLODIPINE BESYLATE 5 MG PO TABS: ORAL_TABLET | ORAL | 3 refills | Status: AC

## 2024-05-01 DIAGNOSIS — L821 Other seborrheic keratosis: Secondary | ICD-10-CM | POA: Diagnosis not present

## 2024-05-01 DIAGNOSIS — D225 Melanocytic nevi of trunk: Secondary | ICD-10-CM | POA: Diagnosis not present

## 2024-05-01 DIAGNOSIS — L578 Other skin changes due to chronic exposure to nonionizing radiation: Secondary | ICD-10-CM | POA: Diagnosis not present

## 2024-05-05 ENCOUNTER — Other Ambulatory Visit: Payer: Self-pay | Admitting: Internal Medicine

## 2024-05-05 DIAGNOSIS — I4819 Other persistent atrial fibrillation: Secondary | ICD-10-CM

## 2024-05-05 NOTE — Telephone Encounter (Signed)
 Prescription refill request for Eliquis  received. Indication:afib Last office visit:8/25 Scr:1.09  6/25 Age: 63 Weight:126.4  kg  Prescription refilled

## 2024-08-17 ENCOUNTER — Ambulatory Visit (HOSPITAL_COMMUNITY): Admitting: Physician Assistant
